# Patient Record
Sex: Male | Born: 1945 | Race: White | Hispanic: No | Marital: Single | Smoking: Former smoker
Health system: Southern US, Community
[De-identification: ages and names within clinical notes are randomized; demographics above are authoritative.]

## PROBLEM LIST (undated history)

## (undated) DIAGNOSIS — I4892 Unspecified atrial flutter: Secondary | ICD-10-CM

## (undated) DIAGNOSIS — J449 Chronic obstructive pulmonary disease, unspecified: Secondary | ICD-10-CM

## (undated) DIAGNOSIS — I4729 Other ventricular tachycardia: Secondary | ICD-10-CM

## (undated) DIAGNOSIS — I472 Ventricular tachycardia: Secondary | ICD-10-CM

## (undated) DIAGNOSIS — F329 Major depressive disorder, single episode, unspecified: Secondary | ICD-10-CM

## (undated) DIAGNOSIS — F32A Depression, unspecified: Secondary | ICD-10-CM

## (undated) DIAGNOSIS — K5190 Ulcerative colitis, unspecified, without complications: Secondary | ICD-10-CM

## (undated) DIAGNOSIS — C61 Malignant neoplasm of prostate: Secondary | ICD-10-CM

## (undated) DIAGNOSIS — Z96649 Presence of unspecified artificial hip joint: Secondary | ICD-10-CM

## (undated) DIAGNOSIS — I10 Essential (primary) hypertension: Secondary | ICD-10-CM

## (undated) DIAGNOSIS — E119 Type 2 diabetes mellitus without complications: Secondary | ICD-10-CM

## (undated) HISTORY — PX: BACK SURGERY: SHX140

## (undated) HISTORY — DX: Unspecified atrial flutter: I48.92

## (undated) HISTORY — DX: Major depressive disorder, single episode, unspecified: F32.9

## (undated) HISTORY — DX: Presence of unspecified artificial hip joint: Z96.649

## (undated) HISTORY — DX: Other ventricular tachycardia: I47.29

## (undated) HISTORY — DX: Ventricular tachycardia: I47.2

## (undated) HISTORY — DX: Malignant neoplasm of prostate: C61

## (undated) HISTORY — PX: TONSILLECTOMY: SUR1361

## (undated) HISTORY — DX: Depression, unspecified: F32.A

## (undated) HISTORY — PX: ILEOSTOMY: SHX1783

## (undated) HISTORY — DX: Type 2 diabetes mellitus without complications: E11.9

## (undated) HISTORY — PX: HIP SURGERY: SHX245

---
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
## 2002-07-05 ENCOUNTER — Encounter: Payer: Self-pay | Admitting: Neurological Surgery

## 2002-07-09 ENCOUNTER — Inpatient Hospital Stay (HOSPITAL_COMMUNITY): Admission: RE | Admit: 2002-07-09 | Discharge: 2002-07-17 | Payer: Self-pay | Admitting: Neurological Surgery

## 2002-07-09 ENCOUNTER — Encounter: Payer: Self-pay | Admitting: Neurological Surgery

## 2002-07-11 ENCOUNTER — Encounter: Payer: Self-pay | Admitting: Neurological Surgery

## 2002-07-11 ENCOUNTER — Encounter: Payer: Self-pay | Admitting: Pulmonary Disease

## 2002-07-12 ENCOUNTER — Encounter: Payer: Self-pay | Admitting: Neurological Surgery

## 2002-07-13 ENCOUNTER — Encounter: Payer: Self-pay | Admitting: Pulmonary Disease

## 2002-07-15 ENCOUNTER — Encounter: Payer: Self-pay | Admitting: Neurological Surgery

## 2002-07-16 ENCOUNTER — Encounter: Payer: Self-pay | Admitting: Cardiology

## 2002-11-13 ENCOUNTER — Ambulatory Visit (HOSPITAL_COMMUNITY): Admission: RE | Admit: 2002-11-13 | Discharge: 2002-11-13 | Payer: Self-pay | Admitting: Neurological Surgery

## 2002-11-15 ENCOUNTER — Ambulatory Visit (HOSPITAL_COMMUNITY): Admission: RE | Admit: 2002-11-15 | Discharge: 2002-11-15 | Payer: Self-pay | Admitting: Neurological Surgery

## 2003-02-14 ENCOUNTER — Observation Stay (HOSPITAL_COMMUNITY): Admission: RE | Admit: 2003-02-14 | Discharge: 2003-02-15 | Payer: Self-pay | Admitting: Neurological Surgery

## 2003-06-16 ENCOUNTER — Ambulatory Visit (HOSPITAL_COMMUNITY): Admission: RE | Admit: 2003-06-16 | Discharge: 2003-06-16 | Payer: Self-pay | Admitting: Neurological Surgery

## 2003-10-09 ENCOUNTER — Ambulatory Visit: Payer: Self-pay | Admitting: Physical Medicine & Rehabilitation

## 2003-10-09 ENCOUNTER — Inpatient Hospital Stay (HOSPITAL_COMMUNITY): Admission: RE | Admit: 2003-10-09 | Discharge: 2003-10-18 | Payer: Self-pay | Admitting: Orthopedic Surgery

## 2003-10-18 ENCOUNTER — Inpatient Hospital Stay
Admission: RE | Admit: 2003-10-18 | Discharge: 2003-10-25 | Payer: Self-pay | Admitting: Physical Medicine & Rehabilitation

## 2003-10-18 ENCOUNTER — Ambulatory Visit: Payer: Self-pay | Admitting: Physical Medicine & Rehabilitation

## 2004-05-05 ENCOUNTER — Ambulatory Visit: Payer: Self-pay | Admitting: Cardiology

## 2005-04-22 ENCOUNTER — Ambulatory Visit: Payer: Self-pay | Admitting: Cardiology

## 2005-05-10 ENCOUNTER — Ambulatory Visit: Payer: Self-pay

## 2005-05-10 ENCOUNTER — Encounter: Payer: Self-pay | Admitting: Cardiology

## 2005-12-20 ENCOUNTER — Ambulatory Visit: Payer: Self-pay | Admitting: Ophthalmology

## 2005-12-27 ENCOUNTER — Ambulatory Visit: Payer: Self-pay | Admitting: Ophthalmology

## 2006-02-01 ENCOUNTER — Encounter: Admission: RE | Admit: 2006-02-01 | Discharge: 2006-02-01 | Payer: Self-pay | Admitting: Orthopedic Surgery

## 2006-04-27 ENCOUNTER — Encounter: Admission: RE | Admit: 2006-04-27 | Discharge: 2006-04-27 | Payer: Self-pay | Admitting: Orthopedic Surgery

## 2006-05-10 ENCOUNTER — Ambulatory Visit: Payer: Self-pay | Admitting: Cardiology

## 2007-02-14 ENCOUNTER — Other Ambulatory Visit: Payer: Self-pay

## 2007-02-14 ENCOUNTER — Ambulatory Visit: Payer: Self-pay | Admitting: Ophthalmology

## 2007-02-27 ENCOUNTER — Ambulatory Visit: Payer: Self-pay | Admitting: Ophthalmology

## 2007-04-20 ENCOUNTER — Ambulatory Visit: Payer: Self-pay | Admitting: Cardiology

## 2007-07-05 ENCOUNTER — Ambulatory Visit: Payer: Self-pay | Admitting: Internal Medicine

## 2007-09-04 ENCOUNTER — Ambulatory Visit: Payer: Self-pay | Admitting: Internal Medicine

## 2008-05-08 ENCOUNTER — Telehealth: Payer: Self-pay | Admitting: Cardiology

## 2008-09-30 ENCOUNTER — Ambulatory Visit: Payer: Self-pay | Admitting: Internal Medicine

## 2008-09-30 DIAGNOSIS — R002 Palpitations: Secondary | ICD-10-CM | POA: Insufficient documentation

## 2008-09-30 DIAGNOSIS — E785 Hyperlipidemia, unspecified: Secondary | ICD-10-CM

## 2008-09-30 DIAGNOSIS — I4892 Unspecified atrial flutter: Secondary | ICD-10-CM

## 2008-09-30 DIAGNOSIS — I10 Essential (primary) hypertension: Secondary | ICD-10-CM

## 2008-12-25 ENCOUNTER — Ambulatory Visit: Payer: Self-pay | Admitting: Orthopedic Surgery

## 2009-02-04 ENCOUNTER — Inpatient Hospital Stay: Payer: Self-pay | Admitting: Surgery

## 2009-10-03 ENCOUNTER — Ambulatory Visit: Payer: Self-pay | Admitting: Internal Medicine

## 2009-10-03 DIAGNOSIS — J40 Bronchitis, not specified as acute or chronic: Secondary | ICD-10-CM | POA: Insufficient documentation

## 2009-11-11 ENCOUNTER — Encounter: Admission: RE | Admit: 2009-11-11 | Discharge: 2009-11-11 | Payer: Self-pay | Admitting: Neurological Surgery

## 2009-12-09 ENCOUNTER — Telehealth: Payer: Self-pay | Admitting: Internal Medicine

## 2010-02-13 ENCOUNTER — Telehealth (INDEPENDENT_AMBULATORY_CARE_PROVIDER_SITE_OTHER): Payer: Self-pay | Admitting: *Deleted

## 2010-02-13 ENCOUNTER — Ambulatory Visit
Admission: RE | Admit: 2010-02-13 | Discharge: 2010-02-13 | Payer: Self-pay | Source: Home / Self Care | Attending: Internal Medicine | Admitting: Internal Medicine

## 2010-02-13 ENCOUNTER — Encounter: Payer: Self-pay | Admitting: Cardiology

## 2010-02-16 ENCOUNTER — Encounter: Payer: Self-pay | Admitting: Cardiovascular Disease

## 2010-02-16 ENCOUNTER — Ambulatory Visit
Admission: RE | Admit: 2010-02-16 | Discharge: 2010-02-16 | Payer: Self-pay | Source: Home / Self Care | Attending: Cardiology | Admitting: Cardiology

## 2010-02-19 NOTE — Assessment & Plan Note (Signed)
Summary: Heart fluttering, increased BP  Nurse Visit   Vital Signs:  Patient profile:   65 year old male Height:      69 inches Pulse rate:   142 / minute BP sitting:   118 / 78  (left arm) Cuff size:   regular  Vitals Entered By: Darlyne Russian RN (February 13, 2010 1:12 PM) CC: Pt c/o not being able to "feel his pulse" BP machine at home not registering. Comments Pt in today, EKG shows rapid a-flutter. Pt is asymptomatic. He has h/o a flutter and was on coumadin in past. Pt not currently on coumadin. He is taking Diltiazem XR 288m once daily and Metoprolol Tartrate 740mtwo times a day. Dr. McAundra Dubinware of EKG and pt hx. We changed pt's Metoprolol to Toprol XL 7528mwo times a day. Pt will f/u Monday 02/16/10 for nurse visit for repeat EKG to see if pt still in a-flutter.    Allergies: 1)  ! Penicillin 2)  ! Sulfa 3)  ! Erythromycin 4)  ! Morphine  Patient Instructions: 1)  Your physician recommends that you schedule a follow-up appointment on Monday 02/16/10. 2)  Your physician has recommended you make the following change in your medication: STOP taking Metoprolol Tartrate. START Toprol XL 20m7m1/2 tablets two times a day.  Prescriptions: TOPROL XL 50 MG XR24H-TAB (METOPROLOL SUCCINATE) Take 1 1/2 tablets by mouth two times a day.  #90 x 6   Entered by:   MegaDarlyne Russian  Authorized by:   DaltLoralie Champagne   Signed by:   MegaDarlyne Russianon 02/13/2010   Method used:   Electronically to        [facility]       [address]   [place]  [id number]   Ph: [phone]   Fax: [phone]:   [id number]

## 2010-02-19 NOTE — Progress Notes (Signed)
Summary: ? about pulse/fluttering heart  Phone Note Call from Patient Call back at Home Phone [phone]   Caller: Patient Call For: Nurse/Dr.Taylor Complaint: Breathing Problems Summary of Call: Pt doesnt feel like he has a pulse. He denies dizziness, chest pain and SOB.  pt states his heart is fluttering some also. pt BP is 142/76, pulse is 90.  Initial call taken by: Rodman Comp CMA,  February 13, 2010 11:05 AM  Follow-up for Phone Call        notified patient needs to come in for nurse visit. Follow-up by: Dolores Lory, [place],  February 13, 2010 12:18 PM

## 2010-02-19 NOTE — Assessment & Plan Note (Signed)
Summary: 1 YR FU MH   Visit Type:  Follow-up Primary Provider:  August Luz.  CC:  c/o problems with stomach and back pain; he is going for physical therapy for lower back..  History of Present Illness: Mr. Rodwell returns today for followup of palpitations and HTN.  The patient has one remote episode of post op atrial flutter documented years ago.  Since then, he's been stable from an arrhythmia perspective with only short lived palpitations.  He denies c/p or sob or peripheral edema. He admits to a chronic cough with bronchitis. No fever or chills.  Current Medications (verified): 1)  Diltiazem Hcl Er Beads 240 Mg Xr24h-Cap (Diltiazem Hcl Er Beads) .... Take One Capsule By Mouth Daily 2)  Metoprolol Tartrate 50 Mg Tabs (Metoprolol Tartrate) .... Take 1 1/2 Tablets By Mouth Two Times A Day 3)  Citalopram Hydrobromide 40 Mg Tabs (Citalopram Hydrobromide) .... Take 1 By Mouth Once Daily 4)  Tramadol Hcl 50 Mg Tabs (Tramadol Hcl) .... Take 1 By Mouth Once Daily 5)  Vitamin B-1 100 Mg Tabs (Thiamine Hcl) .... Take 1 By Mouth Once Daily 6)  Vicodin Es 7.5-750 Mg Tabs (Hydrocodone-Acetaminophen) .... As Needed 7)  Potassium Citrate  27m .... One Tablet Once Daily 8)  Cinnamon 4541m.... Two Tablets Once Daily 9)  Magnesium 200 Mg Tabs (Magnesium) .... One Tablet Once Daily 10)  Flexeril 5 Mg Tabs (Cyclobenzaprine Hcl) .... One Tablet Once Daily 11)  M2 Zinc-50 50 Mg Tabs (Zinc) .... One Tablet Once Daily  Allergies (verified): 1)  ! Penicillin 2)  ! Sulfa 3)  ! Erythromycin 4)  ! Morphine  Past History:  Past Medical History: Last updated: 09/30/2008  1. Palpitations.   2. History of atrial flutter, now well controlled with medical       therapy.   3. Nonsustained ventricular tachycardia of no clinical significance at       present.   4. 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   5. H/O Asthma  6. H/O Prostate Cancer  Review of Systems       The patient complains of  hoarseness.  The patient denies chest pain, syncope, dyspnea on exertion, and peripheral edema.    Vital Signs:  Patient profile:   [age]ear old male Height:      69 inches Weight:      237 pounds BMI:     35.13 Pulse rate:   65 / minute BP sitting:   110 / 68  (left arm) Cuff size:   large  Vitals Entered By: ShDolores LoryCMA (October 03, 2009 10:52 AM)  Physical Exam  General:  Well developed, well nourished, in no acute distress. Head:  normocephalic and atraumatic Eyes:  PERRLA/EOM intact; conjunctiva and lids normal. Mouth:  Teeth, gums and palate normal. Oral mucosa normal. Neck:  Neck supple, no JVD. No masses, thyromegaly or abnormal cervical nodes. Lungs:  Scattered wheezes and rhonchi. Heart:  RRR with normal S1 and S2.  PMI is not enlarged or laterally displaced. Abdomen:  Bowel sounds positive; abdomen soft and non-tender without masses, organomegaly, or hernias noted. No hepatosplenomegaly. Msk:  Back normal, normal gait. Muscle strength and tone normal. Pulses:  pulses normal in all 4 extremities Extremities:  No clubbing or cyanosis. Neurologic:  Alert and oriented x 3.   Impression & Recommendations:  Problem # 1:  ESSENTIAL HYPERTENSION, BENIGN (ICD-401.1) His blood pressure is well controlled.  I have asked him to switch to once daily  metoprolol succinate. Also, a low sodium diet is recommended. His updated medication list for this problem includes:    Diltiazem Hcl Er Beads 240 Mg Xr24h-cap (Diltiazem hcl er beads) .[name]... Take one capsule by mouth daily    Metoprolol Succinate 50 Mg Xr24h-tab (Metoprolol succinate) .[name]... Take 1 tab by mouth at bedtime  Problem # 2:  PALPITATIONS (ICD-785.1) these appear to be well controlled.  Continue meds as  below. His updated medication list for this problem includes:    Diltiazem Hcl Er Beads 240 Mg Xr24h-cap (Diltiazem hcl er beads) .[name]... Take one capsule by mouth daily    Metoprolol Succinate 50 Mg Xr24h-tab  (Metoprolol succinate) .[name]... Take 1 tab by mouth at bedtime  Problem # 3:  BRONCHITIS NOT SPECIFIED AS ACUTE OR CHRONIC (ICD-490) His symptoms are not severe. I have asked him to take Guafenisen.  Patient Instructions: 1)  Your physician has recommended you make the following change in your medication: STOP metoprolo tartrated  START metoprolol succinatel 2)  Your physician wants you to follow-up in:  12 months  You will receive a reminder letter in the mail two months in advance. If you don't receive a letter, please call our office to schedule the follow-up appointment. Prescriptions: METOPROLOL SUCCINATE 50 MG XR24H-TAB (METOPROLOL SUCCINATE) Take 1 tab by mouth at bedtime  #30 x 12   Entered by:   Cordelia Pen, RN   Authorized by:   Sophronia Simas, MD, [facility]   Signed by:   Cordelia Pen, RN on 10/03/2009   Method used:   Electronically to        [facility] (retail)       [address]       [place], [place]  [address]       Ph: [phone]       Fax: [phone]   RxID:   [id number]

## 2010-02-19 NOTE — Progress Notes (Signed)
Summary: Metoprolol Rx  Phone Note Call from Patient Call back at Home Phone [phone]   Caller: self Call For: [name] Summary of Call: Pt is on Metoprolol Suffinate-Would like to go back on medication that he was on before-Pt feels like the previous medication worked better-Does not remember the name of that medication-It was 100+ mg Initial call taken by: Zenovia Jarred,  December 09, 2009 3:42 PM  Follow-up for Phone Call        Called spoke with pt at [facility] with Dr [name] in 09/11 pt was switched from Metoprolol Tartrate 17m 1.5 tablets two times a day to Metoprolol Succinate 564monce daily. Pt states since he has switched he has been having more frequent episodes of his heart fluttering. Pt wants to know if he can switch back.  Please advise.  Thanks. Follow-up by: ErFreddrick MarchN,  December 09, 2009 3:54 PM  Additional Follow-up for Phone Call Additional follow up Details #1::        ok to go back to original dose KeJanan HalterRN, BSN  December 09, 2009 4:30 PM  Called spoke with pt advised of to switch back to Metoprolol Tartrate 5070m.5 tablets two times a day.  Rx sent to [facility] at pt's request.  Pt aware.  Additional Follow-up by: EriFreddrick March,  December 09, 2009 4:42 PM    New/Updated Medications: METOPROLOL TARTRATE 50 MG TABS (METOPROLOL TARTRATE) Take one and a half tablets by mouth twice a day Prescriptions: METOPROLOL TARTRATE 50 MG TABS (METOPROLOL TARTRATE) Take one and a half tablets by mouth twice a day  #90 x 6   Entered by:   EriFreddrick March   Authorized by:   GreSophronia SimasD, FACPinnacle Cataract And Laser Institute LLCSigned by:   EriFreddrick March on 12/09/2009   Method used:   [facility]ically to        [facility]       [address]    [place]  [id number]    Ph: [phone]    Fax: [phone]:   [id number]

## 2010-02-25 NOTE — Assessment & Plan Note (Signed)
Summary: ekg/BP check per Mclean/sab  Nurse Visit   Vital Signs:  Patient profile:   65 year old male Height:      69 inches Weight:      222 pounds BMI:     32.90 Pulse rate:   88 / minute BP sitting:   138 / 72  (left arm) Cuff size:   regular  Vitals Entered By: Dolores Lory, CMA (February 16, 2010 11:00 AM) CC: "Doing well today".  Comments Pt returning for f/u EKG. Pt was in A-Flutter with RVR friday 02/13/10, changed Metoprolol Tartrate 25m two times a day to Toprol XL 792mtwo times a day. Pt also taking Percocet for back and leg pain and he "feels a-flutter symptoms after taking Percocet." Pt's EKG today shows NSR and pt has no c/o. Notified pt that he may have noticed the symptoms after having the back pain rather than the Percocet "causing" the a-flutter. Pt is not currently anticoagulated. Pt will call our office if he has same symptoms again, otherwise, he will continue Toprol XL 7556mwo times a day.    Past History:  Past Medical History: Last updated: 09/30/2008  1. Palpitations.   2. History of atrial flutter, now well controlled with medical       therapy.   3. Nonsustained ventricular tachycardia of no clinical significance at       present.   4. 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   5. H/O Asthma  6. H/O Prostate Cancer   Current Medications (verified): 1)  Diltiazem Hcl Er Beads 240 Mg Xr24h-Cap (Diltiazem Hcl Er Beads) .... Take One Capsule By Mouth Daily 2)  Toprol Xl 50 Mg Xr24h-Tab (Metoprolol Succinate) .... Take 1 1/2 Tablets By Mouth Two Times A Day. 3)  Citalopram Hydrobromide 40 Mg Tabs (Citalopram Hydrobromide) .... Take 1 By Mouth Once Daily 4)  Tramadol Hcl 50 Mg Tabs (Tramadol Hcl) .... Take 1 By Mouth Once Daily 5)  Vitamin B-1 100 Mg Tabs (Thiamine Hcl) .... Take 1 By Mouth Once Daily 6)  Vicodin Es 7.5-750 Mg Tabs (Hydrocodone-Acetaminophen) .... As Needed 7)  Potassium Citrate  74m42m.. One Tablet Once Daily 8)  Cinnamon 450mg46m. Two  Tablets Once Daily 9)  Magnesium 200 Mg Tabs (Magnesium) .... One Tablet Once Daily 10)  Flexeril 5 Mg Tabs (Cyclobenzaprine Hcl) .... One Tablet Once Daily 11)  M2 Zinc-50 50 Mg Tabs (Zinc) .... One Tablet Once Daily  Allergies (verified): 1)  ! Penicillin 2)  ! Sulfa 3)  ! Erythromycin 4)  ! Morphine  CC:  "Doing well today". .[name]

## 2010-04-20 ENCOUNTER — Inpatient Hospital Stay (HOSPITAL_COMMUNITY): Admission: RE | Admit: 2010-04-20 | Payer: Self-pay | Source: Ambulatory Visit | Admitting: Neurological Surgery

## 2010-04-21 ENCOUNTER — Telehealth: Payer: Self-pay | Admitting: Emergency Medicine

## 2010-04-21 NOTE — Telephone Encounter (Signed)
Pt came in office today stating his insurance doesn't cover his metoprolol succinate 46m 1 1/2 tablets bid. Insurance recommended he try a lower tier medication- Verapamil or Propanolol. Please advise. Thanks, SClarise Cruz

## 2010-05-04 ENCOUNTER — Telehealth: Payer: Self-pay | Admitting: Internal Medicine

## 2010-05-04 NOTE — Telephone Encounter (Signed)
Sent staff message to Dr. Lovena Le that pt calling again regarding medication change (see phone note from 04/21/10).

## 2010-05-07 NOTE — Telephone Encounter (Signed)
Would suggest metoprolol tartate 25 twice daily.

## 2010-05-08 ENCOUNTER — Telehealth: Payer: Self-pay | Admitting: *Deleted

## 2010-05-08 MED ORDER — METOPROLOL TARTRATE 25 MG PO TABS: 25.0000 mg | ORAL_TABLET | Freq: Two times a day (BID) | ORAL | Status: DC

## 2010-05-08 NOTE — Telephone Encounter (Signed)
Pt called back after recent change from Metoprolol succ 37m b.i.d to metoprolol tart 25 b.i.d. Pt is concerned that this will not control BP as he has had trouble in the past. Pt was changed due to insurance reasons, but wants to know if we can call in higher dosage? Please advise.

## 2010-05-08 NOTE — Telephone Encounter (Signed)
Duplicate msg. Tried to call pt no answer. Will document on previous msg.

## 2010-05-08 NOTE — Telephone Encounter (Signed)
[facility] TCB/sab

## 2010-05-12 NOTE — Telephone Encounter (Signed)
[name]! Can you ask Dr. Lovena Le about this patient's medication. Pt is calling again regarding his medication. He wants to know if the metoprolol tartrate 88m bid will be enough since he was taking metoprolol succinate 77mbid. Pt also wanted to know if there are any other medications that will be less expensive. Thanks, SaClarise Cruz

## 2010-05-12 NOTE — Telephone Encounter (Signed)
Dr Lovena Le said pt should be on 82m bid

## 2010-05-13 ENCOUNTER — Other Ambulatory Visit: Payer: Self-pay | Admitting: Internal Medicine

## 2010-05-13 MED ORDER — METOPROLOL TARTRATE 50 MG PO TABS: 50.0000 mg | ORAL_TABLET | ORAL | Status: DC

## 2010-05-13 NOTE — Telephone Encounter (Signed)
rx called into pharmacy. Pt notified

## 2010-05-13 NOTE — Telephone Encounter (Signed)
OK thanks. I will call in metoprolol tartrate 81m 1 1/2 tablets bid replacing the succinate that was not covered by insurance.

## 2010-05-14 ENCOUNTER — Other Ambulatory Visit: Payer: Self-pay | Admitting: *Deleted

## 2010-05-14 MED ORDER — DILTIAZEM HCL ER 240 MG PO CP24: 240.0000 mg | ORAL_CAPSULE | Freq: Every day | ORAL | Status: DC

## 2010-06-02 NOTE — Assessment & Plan Note (Signed)
[facility] OFFICE NOTE   NAME:Buikema, JOSEPHUS                        MRN:          [id number]  DATE:07/05/2007                            DOB:          1945-01-31    Mr. Bocock returns today after a long absence from our [facility].  He  is a very pleasant 28-year male with palpitations, dizziness, and near-  syncope who has a history of documented atrial flutter (question atrial  fib) diagnosed several years ago.  He has been on Cardizem since then.  The patient has had an increase in frequency and severity of his  palpitations in the last several months and is here today for additional  evaluation.  He denies chest pain.  He denies shortness of breath except  when he feels his palpitations.  He has had no frank syncope.   His medications include Cartia 240 a day, vitamins, and chondroitin and  glucosamine.   PHYSICAL EXAMINATION:  GENERAL:  He is a pleasant well-appearing middle-  aged man in no distress.  VITAL SIGNS:  Blood pressure was 118/70, the pulse was 68 and regular,  respirations were 18, and weight was 216 pounds.  NECK:  Revealed no jugular venous distention.  LUNGS:  Clear bilaterally to auscultation.  No wheezes, rales, or  rhonchi are present.  CARDIOVASCULAR:  Regular rate and rhythm.  Normal S1 and S2.  EXTREMITIES:  Demonstrate no edema.   EKG demonstrates sinus rhythm with normal axes and intervals.   IMPRESSION:  1. Symptomatic palpitations.  2. Near-syncope secondary to symptomatic palpitations.   DISCUSSION:  Overall, Mr. Flanagan is stable, but I am concerned about  his increased palpitations.  I think that there are not bad enough to  recommend an ablation at this point, but I think we should study these a  little more carefully and I have recommend that he undergo CardioNet  monitoring for this.  We will plan to see the patient back in the office  several weeks after he has  had a chance to wear his CardioNet, and  hopefully, we can better understand the mechanism of his symptoms.     Champ Mungo. Lovena Le, MD  Electronically Signed    GWT/MedQ  DD: 07/05/2007  DT: 07/06/2007  Job #: [id number]   cc:   Benita Stabile

## 2010-06-02 NOTE — Progress Notes (Signed)
[facility] OFFICE NOTE   NAME:Dessert, KAYNEN                        MRN:          [id number]  DATE:09/04/2007                            DOB:          25-Feb-1945    Mr. Ganas returns today for followup.  He is a very pleasant 65-year-  old man I saw back in June for worsening tachy palpitations, dizziness,  and near syncope.  He has a history of atrial flutter, which had been  documented in the past, but his symptoms were not very severe and for  this reason, I have recommend he wear a CardioNet monitor.  This  demonstrated mostly sinus rhythm with occasional PVCs.  He did have a  bout of atrial flutter and he also had a very brief episode of  nonsustained VT, but otherwise no other symptoms.  No other findings  were noted.  He had no significant bradycardia.  He returns today for  followup and overall has been stable.  He tolerates his beta blockers  and calcium channel blockers very nicely.  He does not have angina and  has otherwise been stable.  Review of the [place] monitor is as  demonstrated above.  He did have 1 episode of nonsustained VT at 117  beats per minute lasting for all of 12 beats.  The patient notes that  his symptoms bother him mostly and lies down to sleep at 9.   PHYSICAL EXAMINATION:  GENERAL:  On physical exam he is a pleasant,  middle-aged man in no acute distress.  VITAL SIGNS:  Blood pressure today was 110/70, the pulse was 78 and  regular, respirations were 18, weight was 206 pounds.  NECK:  Revealed no jugular venous distention.  LUNGS:  Clear bilaterally to auscultation.  No wheezes, rales, or  rhonchi are present.  CARDIOVASCULAR EXAM:  Revealed a regular rate and rhythm with normal S1  and S2.  ABDOMINAL:  Soft and nontender.  EXTREMITIES:  Demonstrate no edema.   IMPRESSION:  1. Palpitations.  2. History of atrial flutter, now well controlled with medical  therapy.  3. Nonsustained ventricular tachycardia of no clinical significance at      present.  4. 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   DISCUSSION:  Mr. Chiriboga is stable.  His arrhythmias are well  controlled.  I have recommend a period of watchful waiting and  continuation of his beta-blocker and calcium-channel blocker.  I will  see him back in several months.     Champ Mungo. Lovena Le, MD  Electronically Signed    GWT/MedQ  DD: 09/04/2007  DT: 09/05/2007  Job #: [id number]   cc:   Benita Stabile

## 2010-06-02 NOTE — Assessment & Plan Note (Signed)
[facility] OFFICE NOTE   NAME:[name], [name]                        MRN:          [id number]  DATE:04/20/2007                            DOB:          23-Apr-1945    PRIMARY CARE PHYSICIAN:  Dr. Benita Stabile, [place], [place].   HISTORY:  [name] is a 65 year old and returns for management of his  paroxysmal atrial flutter.  He is currently disabled due to back and  other problems.  [name] has a long history of atrial flutter, and  he was seen in consultation by Dr. Lovena Le, but together they decided not  to proceed with ablation.  He has been managed on beta blocker therapy  and Cardizem.   He says his palpitation has become more recent over the past number of  months.  He says he will get episodes where his heart beats fast and may  last 2 hours.  This may occur several times a month.  He has no  associated chest pain or shortness of breath.   His past medical history significant for hip replacement and back  surgery.  Dr. French Ana has been treating him for this.   CURRENT MEDICATIONS:  1. Flonase.  2. Metoprolol 50 mg 1/2 tablet b.i.d.  3. Cardia XT 180 mg daily.  4. Tramadol.  5. Meloxicam.  6. Gabapentin.   PHYSICAL EXAMINATION:  VITAL SIGNS:  Blood pressure 126/72 and pulse 71  and regular.  NECK:  There was no venous distension.  The carotid pulses were full  without bruits.  CHEST:  Clear.  CARDIAC:  Rhythm is regular.  No murmurs or gallops.  The soft with  normal bowel sounds.  There is no hepatosplenomegaly.  Peripheral pulses  were full with no peripheral edema.   STUDIES:  Electrocardiogram  showed one PVC and was otherwise normal.   IMPRESSION:  1. Paroxysmal atrial fibrillation, atrial flutter.  2. Status post hip replacement.  3. Degenerative disease of the lumbar spine.  4. Chronic disability.  5. Ulcerative colitis status post previous colectomy and colostomy.   RECOMMENDATIONS:  [name] is having increasing symptoms of  palpitations which likely represent atrial flutter.  He has been  reluctant to consider ablation in the past.  Will increase his Cardizem  XT from 180 to 240 a day and continue his Toprol at the current dosage.  If he is not  improved on this regimen he needs to give [place] a call, and we will  consider ablation.  Otherwise, I will see him back in the year.     Bruce Alfonso Patten Olevia Perches, MD, [facility]  Electronically Signed    BRB/MedQ  DD: 04/20/2007  DT: 04/20/2007  Job #: [id number]

## 2010-06-05 NOTE — Op Note (Signed)
NAMEKARTER, HELLMER                           ACCOUNT NO.:  [id number]   MEDICAL RECORD NO.:  [id number]                   PATIENT TYPE:  INP   LOCATION:  3013                                 FACILITY:  [facility]   PHYSICIAN:  Earleen Newport, M.D.               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   POSTOPERATIVE DIAGNOSIS:  Status post lumbar fusion L4 to sacrum with right lumbar radiculopathy.   OPERATION:  Removal of right sided pedicular instrumentation L4, L5, and  sacrum with METRx system and fluoroscopy.   SURGEON:  Earleen Newport, M.D.   ANESTHESIA:  General endotracheal anesthesia.   INDICATIONS FOR PROCEDURE:  The patient is a 65 year old individual who had  significant back and right lower extremity pain.  About a year ago, he  underwent fusion of L4 to the sacrum for spondylolisthesis.  He was doing  well, however, he developed a right lumbar radiculopathy and it was suspect  that he may have had some cut out of a right sided screw at L5.   PROCEDURE:  The patient was brought to the operating room supine on the  stretcher.  After a smooth induction of general endotracheal anesthesia, he  was turned prone.  The back was shaved, prepped with Hibiclens and then  alcohol, and draped in a sterile fashion.  Using fluoroscopic guidance, the  screws on the right side were localized, the skin was marked, and a central  area of incision was created measuring 18 mm in length.  A K-wire was then  passed to the central screw and over the K-wire a series of dilators were  passed after a linear incision was made in the fascia.  The screw head was  isolated with an 18 mm diameter tube and through this the screw head was  loosened.  The top hardware was removed.  A similar procedure was  completed  at L4 and the sacrum.  Then, using some dissection with the Bovie, the area  over the rod was released and the rod could be mobilized with the use of a  right angle hook.  This was brought up through the tube, also.  Then, each  of the screws were sequentially loosened and removed with an appropriate  screwdriver.  The area was inspected radiographically and the hardware was  removed well on the right side.  There was some concern that his arthrodesis  may still be healing and some supplemental fixation would be useful so we  did not remove the left side.  With this, the area was copiously irrigated  with antibiotic irrigating solution.  The fascia was closed with 3-0 Vicryl  in an interrupted fashion, 3-0 Vicryl was used to  close the subcuticular  skin.  The patient tolerated the procedure well.  Blood loss estimated at  about 50 mL.                                               Earleen Newport, M.D.    Drucilla Schmidt  D:  02/14/2003  T:  02/14/2003  Job:  [id number]

## 2010-06-05 NOTE — H&P (Signed)
NAME:  [name], [name]                    ACCOUNT NO.:  [id number]   MEDICAL RECORD NO.:  [id number]                   PATIENT TYPE:  INP   LOCATION:  NA                                   FACILITY:  [facility]   PHYSICIAN:  Lockie Pares, M.D.                 DATE OF BIRTH:  August 16, 1945   DATE OF ADMISSION:  10/09/2003  DATE OF DISCHARGE:                                HISTORY & PHYSICAL   CHIEF COMPLAINT:  Right hip pain for the last two years.   HISTORY OF PRESENT ILLNESS:  This 65 year old white male patient presented  to Dr. French Ana with a two-year history of gradual onset but progressive  right hip pain.  He has had no known injury or prior surgery to his hip, but  the pain has been getting progressively worse over the last week or two.  At  this point, he has a constant sore sensation over the lateral aspect of the  hip with some radiation into the anterior thigh and all the way down to the  knee.  The pain increases if he is very active during the day or if he does  a lot of bending, and then decreases if he sites down.  He has difficulty  putting his socks and shoes on.  He cannot sleep on that right side.  He has  no back pain or paresthesias associated with the pain, although he does  report he has numbness on the bottom of both feet.  He has been ambulating  with a cane intermittently for the last year, and he currently take Vicodin  for pain.   ALLERGIES:  1.  PENICILLIN causes a rash.  2.  ERYTHROMYCIN causes nausea and vomiting.  3.  SULFA causes a rash.  4.  MORPHINE causes nausea and vomiting and severe constipation.   CURRENT MEDICATIONS:  1.  Cartia XT 180 mg 1 tablet p.o. q.a.m.  2.  Clonazepam 0.5 mg 1/2 tablet p.o. q.h.s. p.r.n.  3.  Coumadin 5 mg to take as directed with his last dose planned to      October 02, 2003.  4.  Singulair 10 mg 1 tablet p.o. q.a.m. p.r.n.  5.  Flonase 50 mcg 2 sprays each nostril q.a.m.  6.  Pulmicort 200 mcg 2 puffs inhaled  q.a.m.  7.  Albuterol inhaler 17 g 2 puffs inhaled q.4 h. p.r.n.  8.  Vicodin 5/500 mg 1/2-1 tablet p.o. q.4 h. p.r.n. for pain.  9.  Flax oil Omega-III 2 tablespoons p.o. q.a.m.   PAST MEDICAL HISTORY:  1.  He was diagnosed with atrial flutter in June of 2004.  2.  He was diagnosed with asthma in 1993.  He has not ever required      intubation or hospitalization for it.  3.  He denies any history of diabetes mellitus, hypertension, thyroid      disease, hiatal hernia, reflux, peptic ulcer disease, or  any other      chronic medical condition other than noted previously.   PAST SURGICAL HISTORY:  1.  Tonsillectomy in [place].  2.  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  3.  Debridement left leg in 1981.  4.  Surgery to repair deviated septum in 1983.  5.  Left inguinal herniorrhaphy in 1992.  6.  Sinus surgery in 1996.  7.  Prostatectomy in 2000.  8.  Lumbar fusion in 2004 by Earleen Newport, M.D.  9.  Removal of screws, lumbar spine, 2005 by Earleen Newport, M.D.   The only complication he reports from the above surgery was development of  an ileus due to morphine after the back surgery in 2004. This did require NG  tube placement.   SOCIAL HISTORY:  He has a 9 to 27-pack-year history of cigarette smoking.  He quit in 1978.  He did start using chewing tobacco, cigars, and pipe in  1981, but quit that in 1991.  He does not drink any alcohol nor use any  drugs.  He is single without any children.  He lives with his 73 year old  father in a one-story house with two steps into the main entrance.  He is on  disability.  His primary medical doctor is Benita Stabile, M.D. in [place], and  his phone number if [phone].   FAMILY HISTORY:  Mother died at the age of 72 with liver and pancreatic  cancer, diabetes, osteoarthritis, and osteoporosis.  His father is alive at  age 37 with osteoarthritis and glaucoma.   REVIEW OF SYSTEMS:  He does have occasional dizziness, and that was   evaluated by his cardiologist, who is Champ Mungo. Lovena Le, M.D. at [facility].  His asthma is managed by [facility] the [facility] in  [place].  He does have a cataract in his left eye and decreased vision in  his right eye.  He complains of occasional nasal congestion and occasional  stiff neck.  He does have a history of bronchitis in 2004.  He has some  occasional urinary dysuria and also nocturia at times.  He does wear  glasses.  He had a sinus infection about three weeks ago, and the was  treated effectively with antibiotics.  He does have a Living Will and his  power of attorney is [employer].  All other systems are negative and  noncontributory.   PHYSICAL EXAMINATION:  GENERAL:  Well-developed, well-nourished, mildly  overweight white male who walks with an antalgic gait and the use of a cane.  Mood and affect are appropriate.  Talks easily with the examiner.  Height 5  feet, 9.5 inches, weight 220 pounds.  The BMI is 31.  VITAL SIGNS:  Temperature 98.5 degrees Fahrenheit, pulse 92, respirations  12, BP 128/70.  HEENT:  Normocephalic, atraumatic, without frontal or maxillary sinus  tenderness to palpation.  Conjunctivae pink, sclerae anicteric.  PERRLA,  EOMs intact.  No visible external ear deformities.  Hearing grossly intact.  Unable to visualize tympanic membranes due to canals being occluded with  cerumen.  Nose and nasal septum midline.  Nasal mucosa pink, moist, without  exudates or polyps noted.  Buccal mucosa intact.  Pharynx with erythema or  exudates.  Tongue and uvula midline.  Tongue with fasciculations.  Uvula  rises equally with phonation.  Teeth do have some discoloration from plaque  and his gum line appears to have receded a little bit.  NECK:  No visible masses  or lesions noted.  Trachea midline.  No palpable  lymphadenopathy.  No thyromegaly.  Carotids +2 bilaterally without bruits. Full range of motion, nontender to palpation along the cervical  spine.  CARDIOVASCULAR:  Heart rate and rhythm regular.  S1 and S2 present without  rubs, clicks, or murmurs noted.  RESPIRATORY:  Respirations even and unlabored. Breath sounds clear to  auscultation bilaterally without rales or wheezes noted.  ABDOMEN:  Well-healed midline abdominal incision and ileostomy noted in his  right lower quadrant.  Bowel sounds present x4 quadrants.  Moderate amount  of soft brown stool noted in the ileostomy bag.  Nontender to palpation  without hepatosplenomegaly nor CVA tenderness.  Femoral pulses +1  bilaterally.  He does have a well-healed low lumbar incision line.  Some  mild tenderness to palpation in that area but otherwise no tenderness along  the vertebral column.  BREAST/GENITOURINARY/RECTAL:  These exams deferred at this time.  MUSCULOSKELETAL:  No obvious deformities bilateral upper extremities with  full range of motion of these extremities without pain.  Radial pulses +2.  He has full range of motion of his ankles and toes bilaterally.  DP and PT  pulses are +2 without lower extremity edema.  No calf pain with palpation,  negative Homans' sign bilaterally.  He has full range of motion of his left  knee without pain.  Full extension and flexion of his left hip to about 90  degrees with slightly decreased internal and external rotation, but it is  fairly full.  Right hip has full extension but flexion only to 45 degrees,  and that causes pain.  He has no internal and external rotation at this  time.  He also has decreased range of motion of his right knee.  He does  seem to have some pain along the medial joint line of the right knee, but  then when I sit him up, he does not have pain there.  He has some pain with  movement of the patella, but this seems to be intermittent.  No effusion.  No real crepitus with range of motion of the knee.  No calf pain with  palpation, and negative Homans' sign.  NEUROLOGIC:  Alert and oriented x3.  Cranial nerves  II-XII are grossly  intact.  Strength is 5/5 bilateral upper and lower extremities.  Rapid  alternating movements intact.  Deep tendon reflexes 2+ bilateral upper and  lower extremities.   RADIOLOGIC FINDINGS:  X-rays done in June of 2005 show severely-arthritic  right hip with cystic changes in the superior femoral head and that area has  collapse with bone-on-bone contact in the acetabulum.  The left hip appears  to be normal.   IMPRESSION:  1.  End-stage osteoarthritis, right hip.  2.  Atrial flutter.  3.  Asthma.  4.  History of prostate cancer.  5.  History of ulcerative colitis treated with ileostomy.  6.  Cataract.  7.  Degenerative disk disease, lumbar spine.   PLAN:  Mr. Rossa will be admitted to [facility] on October 09, 2003 where he will undergo a right total hip arthroplasty by Dr.  Vangie Bicker.  He will undergo all the routine preoperative laboratory tests and studies prior to this procedure.  If we have any medical issues while he is  hospitalized, we will consult either his cardiologist or one of the  hospitalists.      Vonita Moss Duffy, P.A.  [place] Pares, M.D.    KED/MEDQ  D:  09/24/2003  T:  09/24/2003  Job:  [id number]

## 2010-06-05 NOTE — Op Note (Signed)
[name], [name]                           ACCOUNT NO.:  [id number]   MEDICAL RECORD NO.:  [id number]                   PATIENT TYPE:  INP   LOCATION:  3172                                 FACILITY:  [facility]   PHYSICIAN:  Earleen Newport, M.D.               DATE OF BIRTH:  [birth year]   DATE OF PROCEDURE:  07/09/2002  DATE OF DISCHARGE:                                 OPERATIVE REPORT   PREOPERATIVE DIAGNOSIS:  Spondylolisthesis at L4-5, spondylosis, plus  herniated nucleus pulposus at L5-S1 with left lumbar radiculopathy.   POSTOPERATIVE DIAGNOSIS:  Spondylolisthesis at L4-5, spondylosis, plus  herniated nucleus pulposus at L5-S1 with left lumbar radiculopathy.   PROCEDURE:  L4 Gil procedure, L5 laminectomy, posterior lumbar interbody  arthrodesis with Synthes bone spacers at L4-5, posterolateral arthrodesis L4  to sacrum, segmental pedicle screw fixation L4, L5, and sacrum.   SURGEON:  Earleen Newport, M.D.   ASSISTANT:  Otilio Connors, M.D.   ANESTHESIA:  General endotracheal.   INDICATIONS FOR PROCEDURE:  The patient is a 65 year old individual who has  had significant back and bilateral lower extremity pain, but worse with left  lower extremity pain more recently. He has evidence of an old disk  herniation at the L5-S1 level that has been treated conservatively and this  now appears large and calcified.  He also has a mobile spondylolisthesis,  has a grade II spondylolisthesis, and a severe stenosis as a result of that.  He was advised regarding surgical decompression and stabilization from L4 to  the sacrum.   DESCRIPTION OF PROCEDURE:  The patient was brought to the operating room  supine and on a stretcher.  After some difficulty with intubation secondary  to an unusual airway, he was nasotracheally intubated and then treated with  general endotracheal anesthesia. He was turned prone. Care was taken to  protect his ostomy site with appropriate padding all around  the site. The  patient was then prepped on his back with Duraprep solution and then draped  in the usual sterile fashion.  A linear incision was made in the back and  carried down to the lumbodorsal fascia which was opened on either side of  the midline to expose the spinous processes of L4, L5, and the sacrum. These  were identified positively with fluoroscopy.  An InstaTrak fluoroscopic unit  was used to view the localizations with the InstaTrak reference arm being  placed on the laminar arch of the sacrum.  Attention was then turned to the  intralaminar space at L4-5 and also at L5-S1. These were cleared and it was  noted that there was a significant looseness in the facet joints at the L4-5  facet. These were undercut severely performing a Artis Delay procedure, removing the  larger portion of the lamina of L4 out to and including the common dural  tube, the foramen of the L4 nerve root superiorly.  These were decompressed  and there was a significant amount of thickened fascial tissue on each side  in the foramen and removal of this tissue allowed for decompression of the  L4 nerve root superiorly. With the patient in a prone position, it was noted  that his spondylolisthesis appeared reduced.  This caused a significant  bulge posteriorly in the disk at the L4-5 level and this was ultimately  incised and the disk was removed totally from side-to-side and left to  right. The procedure was carried out bilaterally, thus completing a complete  diskectomy. Self-retaining interbody spreader was used to enlarge the disk  space and allow for curettage and removal of the full endplate on the  inferior margin of the body of L4 and the superior margin of the body of L5.  Once this was completed, L5-S1 was similarly decompressed, however, after  performing a total laminectomy of the laminar arch of L5, it was noted that  the disk space was so severely sclerotic that entering the disk and removing  the  calcified fragment of disk on that left side was difficult and needed to  be performed with an osteotome. Even despite this removal, the disk space  itself was very tight, was very closed, though it was not fused.  It was  felt that a posterior interbody arthrodesis at this level would be very  difficult to perform and therefore it was decided to perform only a  posterolateral arthrodesis in the intertransverse space between the L5  transverse process and the ala of the sacrum.  Pedicle entry sites were then  chosen by localizing with the InstaTrak fluoroscopic navigational system and  pedicle entry sites were chosen then at L4, L5, and the sacrum. These were  verified by palpation and each of the screw holes were tapped with a 5.5 mm  tap and 6.2 x 40 mm screws were placed in L4 and L5 and the sacrum was  tapped and drilled with 6.2 x 35 mm screws.  The upper right hand screw at  L4, however, was somewhat loosened and laterally positioned on final x-ray  localization.  It was removed.  The screw hole was directed more medially.  However, to engage the screw fully, a 7 mm x 45 mm screw was placed in this  area.  Then, the posterolateral gutters were stripped of musculature and  periosteum. They were decorticated adequately. Bone graft from the  laminectomy and the [facility] procedure was then packed into the lateral gutters  on either side and once this was secured, the screw caps were applied.  65  mm pre contoured rods were inserted with the L4 screws being tightened  first.  A slight amount of compression was placed on L4-5 and this screw was  locked second. L5-S1 was then locked thirdly.  Final localizing radiograph  in the AP and lateral projections identified.  Positioning of the hardware.  The interbody grafts were also positioned.  The interbody grafts that were  placed at L4-5 were done with [facility] bone spacers measuring 11 mm in height. The interbody spacers were packed with bone chips  that were obtained from  the laminectomy. This was mixed with 10 mL of Cortoss bone expander and the  patient's own blood.  Wafers of V-toss bone expander were placed into the  lateral gutters and one wafer was placed over the posterior intralaminar  space from the L4 lamina to the sacral lamina. The dura was protected.  Care  was  taken prior to placing the bone graft. Gelfoam was placed over the  exiting nerve roots as they entered the foramen so that no bone chips would  be positioned directly over the nerve roots themselves.  The rest of the  bone was then adequately packed and care was again taken to make sure that  no bone entered into the region of the foramen.  With this, no spinal fluid  leaks were incurred during this decompression.  Large portion of the dura  had been exposed.  The fixation feeling to be sound, lumbodorsal fascia was  then closed using #1 Vicryl in interrupted fashion, 2-0 Vicryl  subcutaneously, 3-0 Vicryl subcuticularly. The patient tolerated the  procedure well and was returned to the recovery room in stable condition  after placing some Dermabond on the skin. Total estimated blood loss of 750  mL was obtained.                                               Earleen Newport, M.D.    Drucilla Schmidt  D:  07/09/2002  T:  07/10/2002  Job:  [id number]

## 2010-06-05 NOTE — Assessment & Plan Note (Signed)
[place]                                 ON-CALL NOTE   NAME:[name], [name]                        MRN:          [id number]  DATE:07/14/2007                            DOB:          1945-10-03    PRIMARY CARDIOLOGIST:  Dr. Eustace Quail.   HISTORY:  [name] is a 65 year old male who recently had a CardioNet  event monitor placed.  I received a phone call at approximately 06:15 in  the morning of July 14, 2007.  Monitor technician stated that he had an  11 beat run of wide complex tachycardia with a rate of approximately 117  at 4:03.  Technician again stated that the event was asymptomatic,  however, she did not talk to the patient.  It did relay that the  information was self activated.  I have instructed her to fax  information to the office as scheduled.  Again, suggested that she  cannot say if the patient is asymptomatic unless she spoke with the  patient.     Sharyl Nimrod, PA-C  Electronically Signed    EW/MedQ  DD: 07/14/2007  DT: 07/14/2007  Job #: [id number]

## 2010-06-05 NOTE — H&P (Signed)
[name], [name]                           ACCOUNT NO.:  [id number]   MEDICAL RECORD NO.:  [id number]                   PATIENT TYPE:  INP   LOCATION:  2887                                 FACILITY:  [facility]   PHYSICIAN:  Earleen Newport, M.D.               DATE OF BIRTH:  22-Sep-1945   DATE OF ADMISSION:  07/09/2002  DATE OF DISCHARGE:                                HISTORY & PHYSICAL   ADMISSION DIAGNOSES:  Spondylolisthesis L4-L5 with lumbar radiculopathy,  herniated nucleus pulposus, and spondylosis at L5-S1 with lumbar  radiculopathy.   HISTORY OF PRESENT ILLNESS:  [name] is a 65 year old individual whom I  evaluated in the past for herniated nucleus pulposus at L5-S1 on the right  side with right lumbar radiculopathy.  I had initially started following the  man in 1993 for complaints of back and leg pain.  He has had a longstanding  number of medical problems including colostomy for ulcerative colitis.  He  has had increasingly intolerable back pain and right lower extremity pain  with weakness in the right leg, particularly in the anterior aspect of both  thighs.  He had a workup with some x-rays in 2003, and more recently when I  saw him in May, he had x-rays this year that demonstrated that he had a  grade II spondylolisthesis at the level of L4-L5.  There is significant disk  herniation at L5-S1 previously noted from studies in 1995, and I have  suggested that he needed further MRI.  He had this performed, and I reviewed  it in the office a few weeks ago.  This demonstrated that he had a severe  spondylolisthesis with stenosis at L4-L5 and also spondylitic changes with  compression of the canal at L5-S1 because of moderate disease there.  He has  been using hydrocodone for pain control, and this has been working poorly.  After careful consideration of his options, I advised that he should undergo  two-level decompression with arthrodesis at L4-5 and at L5-S1.  This  will be  done through a posterior approach.  He is now being admitted for this  procedure.   PAST MEDICAL HISTORY:  Significantly notable for history of asthma.  He had  prostate surgery in 2000, hernia repair in 1995.  He has had an ileostomy  since 1965.  He uses hydrocodone for pain control.  Clonazepam helps him  sleep, and he uses Tylenol on a p.r.n. basis.   CURRENT MEDICATIONS:  Advair, Allegra, Singulair, Flonase, hydrocodone,  clonazepam, vitamin D, MSM, CoQ-10.   PRIMARY CARE PHYSICIAN:  He is followed by Dr. Benita Stabile and Phillip Heal.   SOCIAL HISTORY:  The patient does not smoke.  He does not drink alcohol.  Weight has been stable at 5 feet 9 inches and 215 pounds.   FAMILY HISTORY:  Notable for diabetes, cancer, heart disease, and  hypertension within the  family.   REVIEW OF SYSTEMS:  Notable that he wears glasses, has a history of  cataracts, nasal congestion and drainage, leg pain with walking, asthma,  arthritis, and back pain.  Prostate cancer, and kidney stones are also  notes.  He has indigestion and pain with eating secondary to gas.  His last  chest x-ray was in April 2004.   PHYSICAL EXAMINATION:  NEUROLOGIC:  He is alert and oriented individual.  He  stands straight and erect without too much difficulty,but he moves about  very slowly.  He is walking with a cane.  His motor strength in iliopsoas  and quads reveals 5/5 strength.  Tibialis anterior strength is slightly weak  at 4/5 bilaterally.  Reflexes are absent in the right patellae, 1+ left  patella, 1+ in both Achilles.  Babinski's are downgoing. Mobility in his  back reveals that he can flex forward at +70 degrees.  He extends normally.  Sensation is intact to pin and light touch in the distal lower extremities.  In the upper extremities, his strength is normal.  Reflexes are intact.  Cranial nerve examination reveals his pupils are 4 mm, briskly reactive to  light and accommodation.  Extraocular movements  are full.  The face is  symmetric to grimace.  Tongue and uvula are midline.  HEENT:  Sclerae and conjunctivae are clear.  Fundi reveal the disks to be  flat.  NECK:  Supple.  No masses are noted.  LUNGS:  Clear to auscultation.  HEART:  Regular rate and rhythm.  ABDOMEN:  Soft.  Bowel sounds are positive.  No masses are noted.  EXTREMITIES:  No cyanosis, clubbing, or edema.   IMPRESSION:  1. The patient has longstanding ileostomy secondary to ulcerative colitis.  2. 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   He is now being admitted to undergo surgery with decompression followed by  stabilization from L4 to sacrum.                                               Earleen Newport, M.D.    Drucilla Schmidt  D:  07/09/2002  T:  07/09/2002  Job:  [id number]

## 2010-06-05 NOTE — Discharge Summary (Signed)
[name], [name]               ACCOUNT NO.:  [id number]   MEDICAL RECORD NO.:  [id number]          PATIENT TYPE:  ORB   LOCATION:  4782                         FACILITY:  [facility]   PHYSICIAN:  Charlett Blake, M.D.DATE OF BIRTH:  1945-06-05   DATE OF ADMISSION:  10/18/2003  DATE OF DISCHARGE:  10/25/2003                                 DISCHARGE SUMMARY   DISCHARGE DIAGNOSES:  1.  Right total hip arthroplasty secondary to osteoarthritis, October 09, 2003.  2.  Pain management.  3.  Chronic atrial fibrillation, with Coumadin therapy.  4.  Asthma.  5.  Postoperative ileus after hip replacement, resolved.  6.  History of ulcerative colitis with ileostomy.  7.  Hypokalemia.   HISTORY OF PRESENT ILLNESS:  A 65 year old white male with history of atrial  fibrillation, on chronic Coumadin, admitted October 09, 2003 with end-  stage changes of the right hip, with no relief with conservative care.  Underwent a right total hip arthroplasty on October 09, 2003 per Dr.  French Ana.  Advised 50% partial weightbearing, Coumadin resumed, and cardiac  rate remained controlled.  Postoperative atrial flutter.  He did receive a  run of intravenous Cardizem.  Followup per [facility].  His  Lopressor was increased to 50 mg b.i.d.  He did have some nausea and  vomiting.  Gastroenterology consulted.  Findings of small ileus.  He was on  a nasogastric tube for a short time.  Diet advanced after being placed on  Reglan.   PAST MEDICAL HISTORY:  See discharge diagnoses.   ALLERGIES:  1.  PENICILLIN.  2.  MORPHINE.  3.  SULFA.  4.  E-MYCIN.   MEDICATIONS PRIOR TO ADMISSION:  Cartia; clonazepam; Coumadin; Singulair;  Flonase; Pulmicort; albuterol inhaler; and Vicodin.   SOCIAL HISTORY:  Lives with 46 year old father in [place].  One-level  home.  Two steps to entry.  Father with limited assistance.  No other local  family.   HOSPITAL COURSE:  Patient with progressive  gains while on rehab services,  with therapies initiated daily.  The following issues were followed during  the patient's rehab course.  Pertaining to [name] right total hip  arthroplasty, surgical site healing nicely.  Staples had been removed.  No  signs of infection.  He was partial weightbearing, with hip precautions.  He  would follow up with Dr. French Ana.  He continued on Vicodin on a limited  basis for pain and good results.  Chronic Coumadin ongoing for atrial  fibrillation, with cardiac rate controlled.  His Lopressor had recently been  increased per [facility] Cardiology.  His latest INR was 2.2.  He had initially  been followed by [facility] for his chronic Coumadin therapy.  He  had requested to be followed closer to his home in [place].  His primary  M.D. who is Dr. Benita Stabile, was contacted prior to the patient's departure  from the hospital to follow Coumadin therapy.  He did have a history of  asthma.  Oxygen saturations were greater than 90%.  He continued on his  Singulair  and albuterol inhaler.  Postoperative ileus resolved.  Bowel  program was regulated.  He was weaned from his Reglan.  He had a history of  ulcerative colitis.  He was independent with the care of his ileostomy bag.  Overall, for his functional mobility he was supervision ambulating with a  rolling walker, supervision bed mobility and transfers, simple setup for  activities of daily living.  Home health therapies had been arranged.   Latest labs showed an INR of 2.2, hemoglobin 11.9, hematocrit 34.4.  Sodium  134, potassium 3.9, BUN 9, creatinine 0.8.   DISCHARGE MEDICATIONS:  1.  Coumadin 4 mg daily.  2.  Lopressor 50 mg b.i.d.  3.  Potassium chloride 20 mEq daily.  4.  Nasonex 2 sprays daily.  5.  Reglan 10 mg a.c. and at bedtime x 1 month.  6.  Protonix 40 mg b.i.d.  7.  Aspirin 81 mg daily.  8.  Cardizem 180 mg daily.  9.  Vicodin as needed for pain.  10. Singulair 10 mg at  bedtime as needed.  11. Albuterol inhaler t.i.d. as needed.   ACTIVITY:  Partial weightbearing with hip precautions.   DIET:  Regular.   WOUND CARE:  Cleanse incision daily with soap and water.   SPECIAL INSTRUCTIONS:  Home health nurse for Coumadin therapy to check INR  on Monday October 28, 2003, with results to Dr. Benita Stabile of [place].  Home health physical and occupational therapy.  He should follow up with Dr.  French Ana, orthopedic services.       DA/MEDQ  D:  10/24/2003  T:  10/24/2003  Job:  [id number]   cc:   Charlett Blake, M.D.  [address]    [place] [address]  Fax: [phone]   Yvette Rack., M.D.  [place]  [place] [address]  Fax: [phone]   Eustace Quail, M.D. [facility]  [address]  [place] [address]  Fax: [phone]

## 2010-06-05 NOTE — Assessment & Plan Note (Signed)
[facility] OFFICE NOTE   NAME:[name], [name]                        MRN:          [id number]  DATE:05/10/2006                            DOB:          10/26/1945    PRIMARY CARE PHYSICIAN:  Dr. Benita Stabile, [place], [place].   CLINICAL COURSE:  [name] is 65 years old and has a history of  paroxysmal atrial flutter which has been managed medically with Toprol  and Cardizem. He saw Dr. Lovena Le in consultation about possible ablation  but they decided to continue medical therapy.   He has done quite well recently. He has had occasional palpitations but  he has had no prolonged episodes like his previous atrial flutter. He  has no symptoms of chest pain or shortness of breath.   PAST MEDICAL HISTORY:  Significant for a previous hip replacement and  back surgery. He is followed by [name] for his back.   CURRENT MEDICATIONS:  1. Flonase.  2. Toprol XL 1-1/2 tablets daily.  3. Cartia XT 180 mg daily.  4. Hydrocodone.   SOCIAL HISTORY:  He is disabled from his back and hip problems.   PHYSICAL EXAMINATION:  VITAL SIGNS:  Blood pressure 130/80 with pulse 77  and regular.  NECK:  There was no vein distention. The carotid pulses were full  without bruits.  CHEST:  Clear.  CARDIAC:  Rhythm was regular. I could hear no murmurs or gallops.  ABDOMEN:  Soft with normal bowel sounds. There was no  hepatosplenomegaly. Peripheral pulses were full with no peripheral  edema.   An electrocardiogram  was normal.   IMPRESSION:  1. Paroxysmal atrial flutter now controlled on rate control      medications.  2. Status post hip replacement.  3. Degenerative disease of the lumbar spine.  4. Disability.  5. Ulcerative colitis status post previous colectomy with colostomy.   RECOMMENDATIONS:  I think [name] is doing fairly well from the  standpoint of his heart. We did not put him on Coumadin because his [place]  score  was 0. We encouraged him to take an aspirin but he had some stomal  bleeding at his colostomy site and he stopped that. I  encouraged him to take a baby coated aspirin. Will continue his same  therapy and I will see him back in a year.     Bruce Alfonso Patten Olevia Perches, MD, [facility]  Electronically Signed    BRB/MedQ  DD: 05/10/2006  DT: 05/10/2006  Job #: [id number]

## 2010-06-05 NOTE — Consult Note (Signed)
NAMECLEVESTER, HELZER                           ACCOUNT NO.:  [id number]   MEDICAL RECORD NO.:  [id number]                   PATIENT TYPE:  INP   LOCATION:  3110                                 FACILITY:  [facility]   PHYSICIAN:  Eustace Quail, M.D.                  DATE OF BIRTH:  06-May-1945   DATE OF CONSULTATION:  07/13/2002  DATE OF DISCHARGE:                                   CONSULTATION   REFERRING PHYSICIAN:  Earleen Newport, M.D. and Dr. Lyda Jester.   REASON FOR REFERRAL:  Evaluation and management of supraventricular  tachycardia.   CHIEF COMPLAINT:  Palpitations.   CLINICAL HISTORY:  Mr. Wirz is 65 years old and on June 21 had back  surgery for spondylolisthesis at L4, L5.  Postoperatively, he developed an  ileus and then tachycardia, initially sinus tachycardia and now  supraventricular tachycardia at a rate of 170.  He was seen in consultation  by Dr. Joya Gaskins who treated him with Cardizem 10 mg bolus and a drip.  His  pressure dropped down in the 90s and this precluded increasing his dose of  Cardizem for the short term.  The patient has symptoms of palpitations, but  no chest pain or shortness of breath.   His past medical history is significant for asthma and he had prostate  cancer surgery in 2000.  He also has a history of ulcerative colitis and is  status post colectomy and has a colostomy.  He also has a history of kidney  stones.   His medications prior to admission include Advair, Allegra,  Singulair,  Flonase, hydrocodone, vitamin D and CoQ10.  He is currently receiving  Advair, Reglan, Protonix and Cardizem drip.   For details of social history,  family history and review of systems, please  see Dr. Trinidad Curet complete note.   PHYSICAL EXAMINATION:  VITAL  SIGNS:  Blood pressure 98/56 and pulse 170 and  regular.  O2 saturations were 96%.  NECK:  There was no definitive venous distention.  The carotid pulses were  full without bruits.  CHEST:  Clear  without wheezes or rales or rhonchi.  CARDIAC:  Rhythm was regular.  Heart sounds were normal.  No murmurs or  gallops.  ABDOMEN:  There was a colostomy in place.  The abdomen was soft and  nontender without hepatosplenomegaly.  EXTREMITIES:  Showed good pulses and there was no trace peripheral edema at  the ankles.  MUSCULOSKELETAL:  Showed no deformities.  SKIN:  Warm and dry.  NEUROLOGIC:  Nonfocal.   His EKG showed supraventricular tachycardia with ventricular rate of 170 and  probable flutter waves.  EKG showed poor R-wave progression and questionable  inferior Q-waves.  His TSH was normal and his MBs and troponins were  negative.  His renal function was normal.   IMPRESSION:  1. Supraventricular tachycardia, probably atrial flutter with 2:1 block.  2. Status post lumbar spine surgery.  3. Postoperative ileus.  4. Asthma.  5. History of ulcerative colitis, status post colectomy with colostomy.   RECOMMENDATIONS:  I agree with the treatment up to the present time with  Cardizem bolus and Cardizem drip.  Since his blood pressure is low we will  add dig to see if we can slow him down.  I am not 100% sure if this is  flutter or SVT and Adenocard may be diagnostic and possibly therapeutic and  will try this.  In the meantime, we will give IV fluid in hope of raising  his blood pressure so we can increase his Cardizem drip to try and slow his  rate.  He has been in the atrial flutter longer than 24 hours so D/C  cardioversion is probably not the best option as long as he is tolerating  the rhythm well.                                               Eustace Quail, M.D.    BB/MEDQ  D:  07/13/2002  T:  07/15/2002  Job:  [id number]

## 2010-06-05 NOTE — Op Note (Signed)
[name], [name]               ACCOUNT NO.:  [id number]   MEDICAL RECORD NO.:  [id number]          PATIENT TYPE:  INP   LOCATION:  5020                         FACILITY:  [facility]   PHYSICIAN:  Yvette Rack., M.D.DATE OF BIRTH:  [birth year]   DATE OF PROCEDURE:  10/09/2003  DATE OF DISCHARGE:                                 OPERATIVE REPORT   PREOPERATIVE DIAGNOSIS:  Severe osteoarthritis, right hip.   POSTOPERATIVE DIAGNOSIS:  Severe osteoarthritis, right hip.   OPERATION:  Right total hip replacement (DePuy Prodigy fully porous-coated  stem with plus 17 mm neck length, 32 head, 52 mm acetabulum, 100 series cup  with cross-link polyethylene.   SURGEON:  [name], M.D.   ASSISTANT:  Thomasenia Bottoms, PA-C.   BLOOD LOSS:  Approximately 250.   DESCRIPTION OF PROCEDURE:  Sterilely prepped and draped in lateral position.  Posterior approach to the hip made with careful protection of the sciatic  nerve, cutting of the head about 1 fingerbreadth above the lesser troch  followed by progressive reaming up to 13 and actually partially to 13.5  reamed to accept the fully porous-coated stem.  Rasping was done with calcar  reaming as well followed by exposure of the acetabulum, and significant  osteoarthritis was encountered.  Reaming was accomplished up to a 51 mm size  to accept a 52 mm cup with about 15-20 degrees of anteversion, 45 degrees of  abduction.  Trial cup 50 bottomed out easily.  The 52 did not.  The final  cup was inserted with the trial liner followed by progressive trial with the  broach up to plus 17 mm which seemed to provide the most stability, restore  leg lengths equally without any undue tension on the sciatic nerve.  Final  acetabulum was inserted followed by the stem.  Again trial neck lengths  tried.  Again, the most optimal restoration of stability with length was the  plus 17 mm neck length.  Hip was reduced.  It seemed to be stable except at  extreme  flexion, adduction, internal rotation.  The capsule labral complex  was repaired with #1 Ethibond and #1 Vicryl, the subcutaneous tissue with 2-  0 Vicryl and the skin with the stapling device.  Compression sterile  dressing was applied with Marcaine in the incision, taken to the recovery  room in stable condition.      WDC/MEDQ  D:  10/09/2003  T:  10/09/2003  Job:  [id number]

## 2010-06-05 NOTE — Discharge Summary (Signed)
[name], [name]               ACCOUNT NO.:  [id number]   MEDICAL RECORD NO.:  [id number]          PATIENT TYPE:  INP   LOCATION:  3151                         FACILITY:  [facility]   PHYSICIAN:  Lockie Pares, M.D.    DATE OF BIRTH:  16-Apr-1945   DATE OF ADMISSION:  10/09/2003  DATE OF DISCHARGE:  10/18/2003                                 DISCHARGE SUMMARY   IDENTIFYING INFORMATION:  Discharged to rehab.   ADMISSION DIAGNOSES:  1.  Right hip end-stage osteoarthritis.  2.  Chronic atrial fibrillation on Coumadin.  3.  Coumadin therapy due to atrial fibrillation.  4.  History of asthma.  5.  History of ileostomy due to ulcerative colitis.  6.  History of previous ileus with IV morphine.  7.  History of prostate cancer.   DISCHARGE DIAGNOSES:  1.  Right total hip arthroplasty.  2.  Postoperative atrial flutter converting into sinus tachycardia.  3.  Postoperative ileus.  4.  Hypokalemia.  5.  History of chronic atrial fibrillation on chronic Coumadin therapy.  6.  History of asthma.  7.  History of ileostomy secondary to ulcerative colitis.  8.  History of left eye cataract.  9.  History of prostate cancer.   HISTORY OF PRESENT ILLNESS:  The patient is a 65 year old white male who  presents with a 2-year history of gradual onset progressively worsening  right hip pain.  No previous injury or surgical procedures done on the leg.  He has constant soreness over the lateral anterior thigh which radiates down  to the knee.  It worsens with activity and range of motion.  He is currently  using Vicodin for relief.  He is currently using a cane over the last year  to assist ambulation.   ALLERGIES:  1.  PENICILLIN.  2.  SULFA.  3.  ERYTHROMYCIN.  4.  MORPHINE.   CURRENT MEDICATIONS:  1.  Cartia XT 180 mg p.o. daily.  2.  Clonazepam 0.5 mg 1/2 tablet p.o. q.h.s. p.r.n.  3.  Coumadin 5 mg daily.  Stopped on September 14th.  4.  Singulair 10 mg p.o. daily p.r.n.  5.  Flonase 50  mcg two sprays each naris q.a.m.  6.  Pulmicort 200 mcg two puffs daily.  7.  Albuterol 17 g two puffs daily p.r.n.  8.  Vicodin 1/2 to 1 tablet p.o. q.4 h. p.r.n.  9.  Flax oil omega-3 two tablespoons daily.   SURGICAL PROCEDURE:  On October 09, 2003, the patient was taken to the OR  by Dr. Earlie Server assisted by Benita Stabile, P.A.-C.  Under general  anesthesia the patient underwent a right total hip arthroplasty with the  following components.  A size 13.5 hip stem, a 32 mm inside diameter and 52  mm outside diameter acetabular liner, a 32 mm diameter a plus 17 femoral  ball and an Apex __________ and a size 55 mm acetabular cup.  The patient  tolerated the procedure well and was transferred to the recovery room and  then to the orthopedic floor in good condition for routine total hip  protocol.   CONSULTS:  1.  The following routine consults requested:  Physical therapy,      occupational therapy, rehab.  2.  A cardiology consult with [facility] was requested for evaluation of the      patient's new onset atrial flutter.  3.  A GI consult was requested for evaluation of the patient's nausea,      vomiting, and ileus.   HOSPITAL COURSE:  On October 09, 2003, the patient was admitted to [facility] under the care of Dr. Earlie Server for a right total hip  arthroplasty.  The patient underwent this procedure without any  complications, tolerated well, and was transferred to the recovery room on  an IV PCA with Dilaudid and started on Coumadin for DVT prophylaxis and for  routine total hip protocol.  On postop day #1, the patient had no complaints  other than just some soreness in his thigh and hip.  He had no shortness of  breath or chest discomfort.  His vital signs were stable.  He had some mild  postoperative blood loss but he was able to tolerate it well.  His  chemistries were stable.  His wound was benign for any signs of infections.  His leg remained motor  vascular intact so the patient was going to proceed  with total hip protocol but later in that evening the patient complained of  some nausea with chest soreness around into the back.  He was evaluated and  found to have atrial flutter on EKG at a rate of 160 and a cardiac consult  was requested for further evaluation.  The patient was initially evaluated  and was given a bolus of diltiazem 60 mg and then started on a diltiazem  drip at 10 mg and was transferred to the telemetry floor.  The patient  remained in atrial flutter for, I believe, just short of 48 hours when he  converted over to sinus rhythm tachycardia.  He also had his diltiazem rate  increased to 15 mg an hour.  The patient during this time his vital signs  otherwise remained stable.  He had no other cardiac related complaints.  No  shortness of breath or discomfort but he did develop some significant nausea  and vomiting and was initially treated with clear liquids and antiemetics  but this continued, so he was evaluated by GI services and felt to have an  ileus.  During their evaluations, they made him NPO.  He was given an NG  tube and over the next 24 to 48 hours the patient's symptoms improved  significantly and he had gradual improvement of bowel sounds.  The NG tube  was able to be clamped off.  He was able to tolerate clear liquids without  any further progression of nausea and vomiting and he was then slowly  transitioned back over to p.o. meds as tolerated.  Due to the amount of  coffee-ground emesis, the patient also had his Coumadin stopped by GI  services and then once the patient was able to tolerate p.o. meds he was  placed on a PPI and had his Coumadin restarted.   During the patient's continued postoperative care, the patient remained  tachycardic in sinus tachycardia so cardiology added a beta-blocker to his medications and was able to transition him over to p.o. meds including the  beta-blocker and  Cardizem.   Over the next several days, the patient had no further complaints.  He was  able  to tolerate a diet well without any nausea or vomiting.  His heart rate  remained fairly well controlled in the low 100s and he was cleared by  cardiology and GI services to resume a rehab course and his activities were  increased.  Due to the patient's multiple issues during this  hospitalization, he was felt to require some rehab services prior to being  discharged to home to help gain strength and independence, so he was  evaluated by their services and he was accepted once cleared by cardiology  and GI services.   Due to the patient's multiple episodes of emesis and hydration, the patient  did have some hypokalemia and this was improved with initially IV and then  p.o. potassium replacements.   On postop day #9, it was felt from GI services, cardiac services, and from  orthopedic services the patient was medically stable and ready for transfer  to rehab services.  Thus so he was accepted and transferred to that unit in  good condition.   LABORATORY DATA:  EKG on September 23rd was sinus tachycardia with possible  left atrial enlargement, ST wave abnormalities consider lateral ischemia at  113 beats per minute.   Chest x-ray on September 21st showed chronic changes, no active disease.   Chest x-ray on September 23rd shows slight bibasilar atelectasis with  abdominal x-ray on September 24th showing severe distention of the structure  in the left upper quadrant favored to represent the stomach, less likely  would be the transverse colon, NG placement is recommended with a repeat  film on September 25th, dilated small bowel consistent with partial small-  bowel obstruction and it showed as decompressed prior gastric distention.   CBC on September 29th:  WBC is 14.4, hemoglobin 11.4, hematocrit 33.5,  platelets 422.   INR on September 30th is 1.1.   Routine chemistries on September 29th:   Sodium of 135, potassium 3.1 down  from 3.8 the day previous with p.o. replacements being given.  Glucose 147  labile between 107 the day previous and 161, as high as 236 three days  previous was felt to be due to inactivity and diet changes, will monitor.  BUN 12, creatinine 0.8.   BNP on September 29th was less than 30 with a TSH of 1.453 and a free T4 of  1.27.  Urinalysis on September 16th was negative.  Blood cultures on  September 22th was no growth after 5 days.   MEDICATIONS UPON DISCHARGE FROM TELEMETRY FLOOR:  1.  Trinsicon one tablet p.o. t.i.d.  2.  Enema of choice p.r.n.  3.  Heparin 3000 units subcu q.12 h.  4.  Vicodin 7.5 mg one or two tablets every 4-6 hours p.r.n.  5.  Tylenol 650 mg p.o. q.4 h. p.r.n.  6.  Robaxin 500 mg p.o. q.6 h. p.r.n.  7.  Reglan 10 mg p.o. q.8 h. p.r.n.  8.  Singulair 10 mg p.o. daily p.r.n.  9.  Albuterol two puffs p.r.n. 10. Nasonex two sprays daily.  11. Protonix 40 mg p.o. daily.  12. Diltiazem 30 mg p.o. q.i.d.  13. Aspirin 325 mg p.o. daily.  14. Diltiazem 30 mg p.o. q.i.d. was discontinued to 180 mg p.o. daily.  15. Lopressor 25 mg p.o. b.i.d.  16. Potassium chloride 20 mEq p.o. b.i.d.  17. Lopressor mentioned above 25 mg p.o. b.i.d. was changed to 50 mg p.o.      b.i.d.   DISCHARGE INSTRUCTIONS:  1.  Medications:  The patient  is to continue meds as dispensed on orthopedic      floor.  2.  Activity:  The patient may be 50% weightbearing with his surgical hip      with the use of a walker and close supervision with ambulation and the      use of a walker.  3.  Wound Care:  The patient should have wound checks daily for any signs of      infection.  Staples removed on postop day #14.  4.  Diet:  No restrictions.  5.  Follow Up:  The patient should have follow up appointment with Dr.      French Ana in his office one week from discharge from rehab services.  6.  The patient should also arrange for a follow up appointment with his       cardiologist and GI doctor upon discharge for evaluation of his      hospitalized issues.      Okey Regal   RWK/MEDQ  D:  11/12/2003  T:  11/12/2003  Job:  [id number]   cc:   Dr. Olevia Perches

## 2010-06-05 NOTE — Discharge Summary (Signed)
   NAMEKHALEE, MAZO                           ACCOUNT NO.:  [id number]   MEDICAL RECORD NO.:  [id number]                   PATIENT TYPE:  INP   LOCATION:  6389                                 FACILITY:  [facility]   PHYSICIAN:  Earleen Newport, M.D.               DATE OF BIRTH:  [birth year]   DATE OF ADMISSION:  07/09/2002  DATE OF DISCHARGE:  07/17/2002                                 DISCHARGE SUMMARY   ADMISSION DIAGNOSIS:  Lumbar spondylolisthesis, L4-5, L5-S1 with lumbar  radiculopathy.   DISCHARGE DIAGNOSES:  1. Lumbar spondylolisthesis, L4-5, L5-S1 with lumbar radiculopathy.  2. Atrial fibrillation and atrial flutter.   CONSULTATIONS:  Critical care medicine and cardiology.   CONDITION ON DISCHARGE:  Improved.   HOSPITAL COURSE:  Mr. Amare Bail is a 65 year old individual who has had  significant back and bilateral leg pain, who was found to have a  spondylolisthesis and had been followed conservatively for a period of over  10 years.  He was failing, having increasing pain and weakness in his left  extremities and was advised regarding surgical decompression of two lower  levels of his lumbar spine.  This was performed on July 09, 2002.  Postoperatively, the patient had a fair amount of back pain.  He was slow to  mobilize.  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  Ultimately, the patient required  placement of an NG tube and hydration.  During this time, the patient  developed a bout of first what was thought to be supraventricular  tachycardia and then it was found that it actually represented atrial  flutter.  He was treated with diltiazem drip and ultimately converted back  to a normal sinus rhythm.  He was seen in concert with critical care  management and cardiology who helped regulate his rate.  At the current  time, he is being maintained on Cardizem orally.  He has been given a  followup for the  cardiologist.  He will be seen by myself in approximately  three weeks' time.  He has his pain reasonably well controlled with the use  of methocarbamol as a muscle relaxer and hydrocodone for pain.                                               Earleen Newport, M.D.    Drucilla Schmidt  D:  07/16/2002  T:  07/16/2002  Job:  [id number]

## 2010-07-02 ENCOUNTER — Encounter: Payer: Self-pay | Admitting: Cardiovascular Disease

## 2010-12-04 ENCOUNTER — Ambulatory Visit (INDEPENDENT_AMBULATORY_CARE_PROVIDER_SITE_OTHER): Payer: Medicare Other | Admitting: Internal Medicine

## 2010-12-04 ENCOUNTER — Encounter: Payer: Self-pay | Admitting: Internal Medicine

## 2010-12-04 VITALS — BP 118/64 | HR 66 | Ht 69.0 in | Wt 223.0 lb

## 2010-12-04 DIAGNOSIS — I10 Essential (primary) hypertension: Secondary | ICD-10-CM

## 2010-12-04 DIAGNOSIS — R002 Palpitations: Secondary | ICD-10-CM

## 2010-12-04 DIAGNOSIS — I4892 Unspecified atrial flutter: Secondary | ICD-10-CM

## 2010-12-04 NOTE — Patient Instructions (Signed)
Your physician recommends that you schedule a follow-up appointment in: 1 year  

## 2010-12-04 NOTE — Assessment & Plan Note (Signed)
His blood pressure remains well controlled. He will continue his current meds, maintain a low sodium diet and lose weight.

## 2010-12-04 NOTE — Assessment & Plan Note (Signed)
He appears to be maintaining NSR. He will continue his current meds.

## 2010-12-04 NOTE — Progress Notes (Signed)
HPI [name] returns today for followup. He is a pleasant 65 yo man with a h/o palpitations and rare Parox. Atrial flutter. In the last year he has been bothered by back problems but has only had one or two episodes of atrial flutter. He denies syncope, near syncope or c/p.  Allergies  Allergen Reactions  . Erythromycin     rash  . Morphine     Feels crazy & itching  . Penicillins     Rash   . Sulfonamide Derivatives     Rash & flushed     Current Outpatient Prescriptions  Medication Sig Dispense Refill  . ALPRAZolam (XANAX) 0.25 MG tablet Take 0.25 mg by mouth at bedtime as needed.        [name] CINNAMON PO Take 900 mg by mouth daily.        [name] diltiazem (DILACOR XR) 240 MG 24 hr capsule Take 1 capsule (240 mg total) by mouth daily.  30 capsule  6  . fluticasone-salmeterol (ADVAIR HFA) 115-21 MCG/ACT inhaler Inhale 2 puffs into the lungs 2 (two) times daily.        . metoprolol (LOPRESSOR) 50 MG tablet Take 1 tablet (50 mg total) by mouth as directed. 1 1/2 tablets two times a day.  90 tablet  6  . POTASSIUM CITRATE PO Take 99 mg by mouth daily.        . Thiamine HCl (VITAMIN B-1) 100 MG tablet Take 100 mg by mouth daily.        . traMADol (ULTRAM) 50 MG tablet Take 50 mg by mouth daily.           Past Medical History  Diagnosis Date  . Palpitations   . Atrial flutter     well controlled with medical therapy  . Nonsustained ventricular tachycardia     no clinical significance at present  . S/P hip replacement     remote  . Asthma   . Prostate cancer     ROS:   All systems reviewed and negative except as noted in the HPI.   History reviewed. No pertinent past surgical history.   Family History  Problem Relation Age of Onset  . Heart attack Father      History   Social History  . Marital Status: Single    Spouse Name: N/A    Number of Children: N/A  . Years of Education: N/A   Occupational History  . Not on file.   Social History Main Topics  . Smoking  status: Former Smoker -- 3.0 packs/day for 10 years    Types: Cigarettes    Quit date: 12/03/1988  . Smokeless tobacco: Not on file  . Alcohol Use: 0.5 oz/week    1 drink(s) per week  . Drug Use: No  . Sexually Active: Not on file   Other Topics Concern  . Not on file   Social History Narrative  . No narrative on file     BP 118/64  Pulse 66  Ht 5' 9"  (1.753 m)  Wt 101.152 kg (223 lb)  BMI 32.93 kg/m2  Physical Exam:  Well appearing NAD HEENT: Unremarkable Neck:  No JVD, no thyromegally Lymphatics:  No adenopathy Back:  No CVA tenderness Lungs:  Clear with no wheezes, rales, or rhonchi. HEART:  Regular rate rhythm, no murmurs, no rubs, no clicks Abd:  soft, positive bowel sounds, no organomegally, no rebound, no guarding Ext:  2 plus pulses, no edema, no cyanosis, no clubbing Skin:  No rashes no nodules Neuro:  CN II through XII intact, motor grossly intact  EKG NSR   Assess/Plan:

## 2010-12-14 ENCOUNTER — Other Ambulatory Visit: Payer: Self-pay

## 2010-12-14 MED ORDER — DILTIAZEM HCL ER 240 MG PO CP24: 240.0000 mg | ORAL_CAPSULE | Freq: Every day | ORAL | Status: DC

## 2011-07-12 ENCOUNTER — Other Ambulatory Visit: Payer: Self-pay | Admitting: *Deleted

## 2011-07-12 MED ORDER — DILTIAZEM HCL ER 240 MG PO CP24: 240.0000 mg | ORAL_CAPSULE | Freq: Every day | ORAL | Status: DC

## 2011-10-01 ENCOUNTER — Ambulatory Visit: Payer: Self-pay | Admitting: Urology

## 2011-10-05 ENCOUNTER — Ambulatory Visit: Payer: Self-pay | Admitting: Urology

## 2012-02-15 ENCOUNTER — Encounter: Payer: Self-pay | Admitting: Internal Medicine

## 2012-02-15 ENCOUNTER — Ambulatory Visit (INDEPENDENT_AMBULATORY_CARE_PROVIDER_SITE_OTHER): Payer: Medicare Other | Admitting: Internal Medicine

## 2012-02-15 VITALS — BP 110/68 | HR 67 | Ht 69.0 in | Wt 204.8 lb

## 2012-02-15 DIAGNOSIS — R079 Chest pain, unspecified: Secondary | ICD-10-CM

## 2012-02-15 DIAGNOSIS — R0602 Shortness of breath: Secondary | ICD-10-CM

## 2012-02-15 DIAGNOSIS — I10 Essential (primary) hypertension: Secondary | ICD-10-CM

## 2012-02-15 DIAGNOSIS — R002 Palpitations: Secondary | ICD-10-CM

## 2012-02-15 NOTE — Assessment & Plan Note (Signed)
His blood pressure today is improved. As he continues to lose weight, he may be able to get off of one of his AV nodal blocking agents.

## 2012-02-15 NOTE — Progress Notes (Signed)
HPI [name] returns today for followup. He is a very pleasant 67 year old man with a history of obesity, hypertension, palpitations do to nonsustained VT, who returns for arrhythmia followup. In the interim, he has had no syncope. He has minimal nonexertional chest pain. He has lost approximately 20 pounds in the last year. He notes that he is trying to eat less. He has continued to stop smoking. Allergies  Allergen Reactions  . Erythromycin     rash  . Morphine     Feels crazy & itching  . Penicillins     Rash   . Sulfonamide Derivatives     Rash & flushed     Current Outpatient Prescriptions  Medication Sig Dispense Refill  . Ascorbic Acid (VITAMIN C) 1000 MG tablet Take 1,000 mg by mouth 2 (two) times daily.      [name] diltiazem (DILACOR XR) 240 MG 24 hr capsule Take 1 capsule (240 mg total) by mouth daily.  30 capsule  11  . Garlic 828 MG TABS Take by mouth. 2 tablets twice daily.      [name] HAWTHORN PO Take 225 mg by mouth 2 (two) times daily.      . Iodine, Kelp, (KELP PO) Take 600 mg by mouth 2 (two) times daily.      . metoprolol (LOPRESSOR) 50 MG tablet Take 1 tablet (50 mg total) by mouth as directed. 1 1/2 tablets two times a day.  90 tablet  6  . Tamsulosin HCl (FLOMAX) 0.4 MG CAPS as needed.       . Taurine 500 MG CAPS Take by mouth daily.      . Thiamine HCl (VITAMIN B-1) 100 MG tablet Take 100 mg by mouth daily.        . traMADol (ULTRAM) 50 MG tablet Take 50 mg by mouth daily.        [name] MIXJECT 22.5 MG injection          Past Medical History  Diagnosis Date  . Palpitations   . Atrial flutter     well controlled with medical therapy  . Nonsustained ventricular tachycardia     no clinical significance at present  . S/P hip replacement     remote  . Asthma   . Prostate cancer     ROS:   All systems reviewed and negative except as noted in the HPI.   History reviewed. No pertinent past surgical history.   Family History  Problem Relation Age of  Onset  . Heart attack Father      History   Social History  . Marital Status: Single    Spouse Name: N/A    Number of Children: N/A  . Years of Education: N/A   Occupational History  . Not on file.   Social History Main Topics  . Smoking status: Former Smoker -- 3.0 packs/day for 10 years    Types: Cigarettes    Quit date: 12/03/1988  . Smokeless tobacco: Not on file  . Alcohol Use: 0.5 oz/week    1 drink(s) per week  . Drug Use: No  . Sexually Active: Not on file   Other Topics Concern  . Not on file   Social History Narrative  . No narrative on file     BP 110/68  Pulse 67  Ht 5' 9"  (1.753 m)  Wt 204 lb 12 oz (92.874 kg)  BMI 30.24 kg/m2  Physical Exam:  Well appearing 67 year old man, NAD HEENT: Unremarkable Neck:  No JVD, no thyromegally Lungs:  Clear with no wheezes, rales, or rhonchi. HEART:  Regular rate rhythm, no murmurs, no rubs, no clicks Abd:  soft, positive bowel sounds, no organomegally, no rebound, no guarding Ext:  2 plus pulses, no edema, no cyanosis, no clubbing Skin:  No rashes no nodules Neuro:  CN II through XII intact, motor grossly intact  EKG Normal sinus rhythm Assess/Plan:

## 2012-02-15 NOTE — Assessment & Plan Note (Signed)
He had no symptomatic atrial arrhythmias. He will undergo watchful waiting.

## 2012-02-15 NOTE — Patient Instructions (Addendum)
Your physician wants you to follow-up in: 1 year with Dr. Lovena Le. You will receive a reminder letter in the mail two months in advance. If you don't receive a letter, please call our office to schedule the follow-up appointment.

## 2012-07-11 ENCOUNTER — Other Ambulatory Visit: Payer: Self-pay | Admitting: Emergency Medicine

## 2012-07-11 MED ORDER — DILTIAZEM HCL ER 240 MG PO CP24: 240.0000 mg | ORAL_CAPSULE | Freq: Every day | ORAL | Status: DC

## 2013-02-20 ENCOUNTER — Ambulatory Visit (INDEPENDENT_AMBULATORY_CARE_PROVIDER_SITE_OTHER): Payer: Medicare HMO | Admitting: Internal Medicine

## 2013-02-20 ENCOUNTER — Encounter: Payer: Self-pay | Admitting: Internal Medicine

## 2013-02-20 VITALS — BP 82/62 | HR 54 | Ht 69.5 in | Wt 207.0 lb

## 2013-02-20 DIAGNOSIS — I4892 Unspecified atrial flutter: Secondary | ICD-10-CM

## 2013-02-20 DIAGNOSIS — I472 Ventricular tachycardia, unspecified: Secondary | ICD-10-CM

## 2013-02-20 DIAGNOSIS — I9589 Other hypotension: Secondary | ICD-10-CM

## 2013-02-20 DIAGNOSIS — I4729 Other ventricular tachycardia: Secondary | ICD-10-CM

## 2013-02-20 MED ORDER — METOPROLOL TARTRATE 25 MG PO TABS: 25.0000 mg | ORAL_TABLET | Freq: Two times a day (BID) | ORAL | Status: DC

## 2013-02-20 NOTE — Patient Instructions (Signed)
Your physician wants you to follow-up in: 6 months. You will receive a reminder letter in the mail two months in advance. If you don't receive a letter, please call our office to schedule the follow-up appointment.  Your physician has recommended you make the following change in your medication:  Decrease Metoprolol to 25 mg twice daily

## 2013-02-20 NOTE — Assessment & Plan Note (Signed)
The patient has recurrent atrial flutter. I've asked him to consider catheter ablation. The potential benefits would include the need to stop the medications which are contributing to hypotension, allow for the use of renal protection for his diabetes with an ACE inhibitor, I have given him permission from the [employer] of cardiology and he will let us know what he would choose to do. In the event that he would like to pursue catheter ablation, I will asked Dr. Lovena Le to like to do as he knows the patient better

## 2013-02-20 NOTE — Progress Notes (Signed)
.  kf      Patient Care Team: Morton Peters., MD as PCP - General (Unknown Physician Specialty)   HPI  [name] is a 68 y.o. male With a history of atrial flutter identified initially following back surgery in 2004 a clinical recurrence of which is noted in 2012. It was relatively typical. He has had few clinical recurrences.  Echocardiogram 2007I am he is on demonstrated normal LV function and no significant valvular or structural abnormalities  Functional status is limited more by his back then by breathlessness.  He denies chest pain. He's had no intercurrent lateralizing neurological symptoms.  He is not taking anticoagulation  He denies dizziness or orthostatic symptoms  The chart is replete with a diagnosis of nonsustained ventricular tachycardia which was described as "no clinical significance"  Past Medical History  Diagnosis Date  . Palpitations   . Atrial flutter     well controlled with medical therapy  . Nonsustained ventricular tachycardia     no clinical significance at present  . S/P hip replacement     remote  . Asthma   . Prostate cancer   . Diabetes mellitus without complication   . Depression     History reviewed. No pertinent past surgical history.  Current Outpatient Prescriptions  Medication Sig Dispense Refill  . Ascorbic Acid (VITAMIN C) 1000 MG tablet Take 1,000 mg by mouth 2 (two) times daily.      . beclomethasone (QVAR) 80 MCG/ACT inhaler Inhale 2 puffs into the lungs 2 (two) times daily.      . clorazepate (TRANXENE) 3.75 MG tablet Take 3.75 mg by mouth 2 (two) times daily as needed for anxiety.      [name] diltiazem (DILACOR XR) 240 MG 24 hr capsule Take 1 capsule (240 mg total) by mouth daily.  30 capsule  11  . metFORMIN (GLUCOPHAGE) 500 MG tablet Take 250 mg by mouth 2 (two) times daily with a meal.      . metoprolol (LOPRESSOR) 50 MG tablet Take 1 tablet (50 mg total) by mouth as directed. 1 1/2 tablets two times a day.  90  tablet  6  . Multiple Vitamin (MULTIVITAMIN) tablet Take 1 tablet by mouth daily.      . Thiamine HCl (VITAMIN B-1) 250 MG tablet Take 250 mg by mouth daily.      [name] MIXJECT 22.5 MG injection       . venlafaxine (EFFEXOR) 37.5 MG tablet Take 37.5 mg by mouth daily.       No current facility-administered medications for this visit.    Allergies  Allergen Reactions  . Erythromycin     rash  . Morphine     Feels crazy & itching  . Penicillins     Rash   . Sulfonamide Derivatives     Rash & flushed    Review of Systems negative except from HPI and PMH  Physical Exam BP 82/62  Pulse 54  Ht 5' 9.5" (1.765 m)  Wt 207 lb (93.895 kg)  BMI 30.14 kg/m2 Well developed and well nourished in no acute distress HENT normal E scleral and icterus clear Neck Supple JVP flat; carotids brisk and full Clear to ausculation  Regular rate and rhythm, no murmurs gallops or rub Soft with active bowel sounds No clubbing cyanosis none Edema Alert and oriented, grossly normal motor and sensory function although he walks with a stick Skin Warm and Dry    Assessment and  Plan

## 2013-02-20 NOTE — Assessment & Plan Note (Signed)
Described but not seen in a thorough review of the chart

## 2013-02-20 NOTE — Assessment & Plan Note (Signed)
His medications and being used for his arrhythmia. We will decrease his metoprolol from 75--25 mg twice daily.

## 2013-07-06 ENCOUNTER — Other Ambulatory Visit: Payer: Self-pay

## 2013-07-06 MED ORDER — DILTIAZEM HCL ER 240 MG PO CP24: 240.0000 mg | ORAL_CAPSULE | Freq: Every day | ORAL | Status: DC

## 2013-08-21 ENCOUNTER — Encounter: Payer: Self-pay | Admitting: Internal Medicine

## 2013-08-21 ENCOUNTER — Ambulatory Visit (INDEPENDENT_AMBULATORY_CARE_PROVIDER_SITE_OTHER): Payer: Medicare HMO | Admitting: Internal Medicine

## 2013-08-21 ENCOUNTER — Encounter: Payer: Self-pay | Admitting: *Deleted

## 2013-08-21 VITALS — BP 120/66 | HR 64 | Ht 69.0 in | Wt 195.5 lb

## 2013-08-21 DIAGNOSIS — I4729 Other ventricular tachycardia: Secondary | ICD-10-CM

## 2013-08-21 DIAGNOSIS — I4892 Unspecified atrial flutter: Secondary | ICD-10-CM

## 2013-08-21 DIAGNOSIS — R079 Chest pain, unspecified: Secondary | ICD-10-CM

## 2013-08-21 DIAGNOSIS — I472 Ventricular tachycardia, unspecified: Secondary | ICD-10-CM

## 2013-08-21 NOTE — Patient Instructions (Addendum)
[name]  Your caregiver has ordered a Stress Test with nuclear imaging. The purpose of this test is to evaluate the blood supply to your heart muscle. This procedure is referred to as a "Non-Invasive Stress Test." This is because other than having an IV started in your vein, nothing is inserted or "invades" your body. Cardiac stress tests are done to find areas of poor blood flow to the heart by determining the extent of coronary artery disease (CAD). Some patients exercise on a treadmill, which naturally increases the blood flow to your heart, while others who are  unable to walk on a treadmill due to physical limitations have a pharmacologic/chemical stress agent called Lexiscan . This medicine will mimic walking on a treadmill by temporarily increasing your coronary blood flow.   Please note: these test may take anywhere between 2-4 hours to complete  PLEASE REPORT TO [facility] AT THE FIRST DESK WILL DIRECT YOU WHERE TO GO  Date of Procedure:___________8/10/15__________________________  Arrival Time for Procedure:_______0945 am_______________________  Instructions regarding medication:   __x__ : Hold diabetes medication morning of procedure    PLEASE NOTIFY THE OFFICE AT LEAST 24 HOURS IN ADVANCE IF YOU ARE UNABLE TO KEEP YOUR APPOINTMENT.  [phone] AND  PLEASE NOTIFY NUCLEAR MEDICINE AT [facility] AT LEAST 24 HOURS IN ADVANCE IF YOU ARE UNABLE TO KEEP YOUR APPOINTMENT. [phone]  How to prepare for your Myoview test:  1. Do not eat or drink after midnight 2. No caffeine for 24 hours prior to test 3. No smoking 24 hours prior to test. 4. Your medication may be taken with water.  If your doctor stopped a medication because of this test, do not take that medication. 5. Ladies, please do not wear dresses.  Skirts or pants are appropriate. Please wear a short sleeve shirt. 6. No perfume, cologne or lotion. 7. Wear comfortable walking shoes. No  heels!        Your physician has recommended that you have an ablation please follow instruction sheet given. Catheter ablation is a medical procedure used to treat some cardiac arrhythmias (irregular heartbeats). During catheter ablation, a long, thin, flexible tube is put into a blood vessel in your groin (upper thigh), or neck. This tube is called an ablation catheter. It is then guided to your heart through the blood vessel. Radio frequency waves destroy small areas of heart tissue where abnormal heartbeats may cause an arrhythmia to start. Please see the instruction sheet given to you today.  Your physician has recommended you make the following change in your medication:  Stop your Metoprolol    Your physician recommends that you return for lab work on: 09/03/13 for labs  CBC  BMP INR

## 2013-08-21 NOTE — Progress Notes (Signed)
[name]      Patient Care Team: Morton Peters., MD as PCP - General (Unknown Physician Specialty)   HPI  [name] is a 68 y.o. male With a history of atrial flutter identified initially following back surgery in 2004.  Clinical recurrence of which was noted in 2012. [name] ) It was relatively typical.  I do not see discussion in the chart regarding anticoagulation on catheter ablation.  Echocardiogram 2007 demonstrated normal LV function and no significant valvular or structural abnormalities  Functional status is limited more by his back then by breathlessness.  He he has occasional chest pain which radiates to his back. It is unrelated to exertion.[name] He's had no intercurrent lateralizing neurological symptoms.  He is not taking anticoagulation. Thrombolic risk factors are notable for age, hypertension, diabetes. CHADS-VASc score is greater than or equal to 3  He denies dizziness or orthostatic symptoms  The chart is replete with a diagnosis of nonsustained ventricular tachycardia which was described as "no clinical significance"  Last ech 2007   Past Medical History  Diagnosis Date  . Palpitations   . Atrial flutter     well controlled with medical therapy  . Nonsustained ventricular tachycardia     no clinical significance at present  . S/P hip replacement     remote  . Asthma   . Prostate cancer   . Diabetes mellitus without complication   . Depression     History reviewed. No pertinent past surgical history.  Current Outpatient Prescriptions  Medication Sig Dispense Refill  . beclomethasone (QVAR) 80 MCG/ACT inhaler Inhale 2 puffs into the lungs 2 (two) times daily.      [name] CALCIUM CITRATE-VITAMIN D PO Take 2,000 Units by mouth daily.      . clorazepate (TRANXENE) 3.75 MG tablet Take 0.5 mg by mouth at bedtime.       . cyclobenzaprine (FLEXERIL) 10 MG tablet Take 10 mg by mouth 3 (three) times daily as needed for muscle spasms.      [name] diltiazem (DILACOR  XR) 240 MG 24 hr capsule Take 1 capsule (240 mg total) by mouth daily.  30 capsule  6  . metFORMIN (GLUCOPHAGE) 500 MG tablet Take 250 mg by mouth 2 (two) times daily with a meal.      . metoprolol tartrate (LOPRESSOR) 25 MG tablet Take 1 tablet (25 mg total) by mouth 2 (two) times daily.  180 tablet  3  . Multiple Vitamin (MULTIVITAMIN) tablet Take 1 tablet by mouth daily.      . Omega-3 Fatty Acids (OMEGA-3 FISH OIL PO) Take 700 mg by mouth daily.      . Thiamine HCl (VITAMIN B-1) 250 MG tablet Take 250 mg by mouth daily.      [name] MIXJECT 22.5 MG injection Inject 22.5 mg into the muscle every 6 (six) months.       . venlafaxine (EFFEXOR) 75 MG tablet Take 75 mg by mouth daily.      . vitamin C (ASCORBIC ACID) 500 MG tablet Take 500 mg by mouth daily.       No current facility-administered medications for this visit.    Allergies  Allergen Reactions  . Erythromycin     rash  . Morphine     Feels crazy & itching  . Penicillins     Rash   . Sulfonamide Derivatives     Rash & flushed    Review of Systems negative except from HPI and PMH  Physical Exam BP 120/66  Pulse 64  Ht 5' 9"  (1.753 m)  Wt 195 lb 8 oz (88.678 kg)  BMI 28.86 kg/m2 Well developed and well nourished in no acute distress HENT normal E scleral and icterus clear Neck Supple JVP flat; carotids brisk and full Clear to ausculation  Regular rate and rhythm, no murmurs gallops or rub Soft with active bowel sounds No clubbing cyanosis trace Edema Alert and oriented, grossly normal motor and sensory function although he walks with a stick Skin Warm and Dry  ECG SR 64  19/10/42  Assessment and  Plan  Atrial flutter Chest pain- atypical   the patient has had recurrent documented atrial flutter with a prior history of one-to-one conduction. I think it is reasonable to pursue catheter ablation to eliminate this substrate and this risk. He has not had atrial fibrillation and so we potentially would  reduce his thromboembolic risk as well.  It has been years since there is evaluation of LV function. We'll undertake a Myoview as this will help was also addressed he is coronary perfusion; he is also nonambulatory.  Given his rheumatoid arthritis, undertake this procedure with general anesthesia.  Given his fatigue, we will also the interim, stop his metoprolol and see if this abates. I wonder whether he has sleep apnea.

## 2013-08-23 ENCOUNTER — Telehealth: Payer: Self-pay | Admitting: *Deleted

## 2013-08-23 NOTE — Telephone Encounter (Signed)
Reviewed procedure date and times with patient  Patient verbalized understanding

## 2013-08-23 NOTE — Telephone Encounter (Signed)
Pt called again, still awaiting a call .

## 2013-08-23 NOTE — Telephone Encounter (Signed)
Patient called and has some questions on his procedure he is having.

## 2013-08-27 ENCOUNTER — Ambulatory Visit: Payer: Self-pay | Admitting: Internal Medicine

## 2013-08-27 DIAGNOSIS — R079 Chest pain, unspecified: Secondary | ICD-10-CM

## 2013-08-28 ENCOUNTER — Encounter: Payer: Self-pay | Admitting: Internal Medicine

## 2013-08-30 ENCOUNTER — Telehealth: Payer: Self-pay | Admitting: *Deleted

## 2013-08-30 NOTE — Telephone Encounter (Signed)
Please call patient. He is confuse about his procedure???

## 2013-08-30 NOTE — Telephone Encounter (Signed)
Reviewed ablation instructions with patient  Patient verbalized understanding

## 2013-09-03 ENCOUNTER — Other Ambulatory Visit: Payer: Medicare HMO

## 2013-09-04 ENCOUNTER — Ambulatory Visit (INDEPENDENT_AMBULATORY_CARE_PROVIDER_SITE_OTHER): Payer: Medicare HMO | Admitting: *Deleted

## 2013-09-04 ENCOUNTER — Encounter: Payer: Self-pay | Admitting: *Deleted

## 2013-09-04 DIAGNOSIS — I4892 Unspecified atrial flutter: Secondary | ICD-10-CM

## 2013-09-05 ENCOUNTER — Encounter (HOSPITAL_COMMUNITY): Payer: Self-pay | Admitting: Pharmacy Technician

## 2013-09-05 LAB — CBC WITH DIFFERENTIAL
BASOS ABS: 0.1 10*3/uL (ref 0.0–0.2)
Basos: 1 %
Eos: 8 %
Eosinophils Absolute: 0.8 10*3/uL — ABNORMAL HIGH (ref 0.0–0.4)
HCT: 42.3 % (ref 37.5–51.0)
Hemoglobin: 14 g/dL (ref 12.6–17.7)
IMMATURE GRANS (ABS): 0 10*3/uL (ref 0.0–0.1)
IMMATURE GRANULOCYTES: 0 %
LYMPHS: 26 %
Lymphocytes Absolute: 2.6 10*3/uL (ref 0.7–3.1)
MCH: 29 pg (ref 26.6–33.0)
MCHC: 33.1 g/dL (ref 31.5–35.7)
MCV: 88 fL (ref 79–97)
Monocytes Absolute: 1 10*3/uL — ABNORMAL HIGH (ref 0.1–0.9)
Monocytes: 10 %
Neutrophils Absolute: 5.3 10*3/uL (ref 1.4–7.0)
Neutrophils Relative %: 55 %
PLATELETS: 259 10*3/uL (ref 150–379)
RBC: 4.83 x10E6/uL (ref 4.14–5.80)
RDW: 13.8 % (ref 12.3–15.4)
WBC: 9.8 10*3/uL (ref 3.4–10.8)

## 2013-09-05 LAB — BASIC METABOLIC PANEL
BUN/Creatinine Ratio: 20 (ref 10–22)
BUN: 16 mg/dL (ref 8–27)
CHLORIDE: 101 mmol/L (ref 97–108)
CO2: 25 mmol/L (ref 18–29)
Calcium: 9.6 mg/dL (ref 8.6–10.2)
Creatinine, Ser: 0.79 mg/dL (ref 0.76–1.27)
GFR calc Af Amer: 107 mL/min/{1.73_m2} (ref >59)
GFR calc non Af Amer: 93 mL/min/{1.73_m2} (ref >59)
GLUCOSE: 140 mg/dL — AB (ref 65–99)
POTASSIUM: 4.7 mmol/L (ref 3.5–5.2)
Sodium: 141 mmol/L (ref 134–144)

## 2013-09-05 LAB — PROTIME-INR
INR: 1 (ref 0.8–1.2)
Prothrombin Time: 10.8 s (ref 9.1–12.0)

## 2013-09-07 ENCOUNTER — Encounter (HOSPITAL_COMMUNITY): Payer: Self-pay | Admitting: Anesthesiology

## 2013-09-07 ENCOUNTER — Ambulatory Visit (HOSPITAL_COMMUNITY)
Admission: RE | Admit: 2013-09-07 | Discharge: 2013-09-08 | Disposition: A | Discharge status: Home / Self Care | Payer: Medicare HMO | Source: Ambulatory Visit | Attending: Internal Medicine | Admitting: Internal Medicine

## 2013-09-07 ENCOUNTER — Ambulatory Visit (HOSPITAL_COMMUNITY): Payer: Medicare HMO | Admitting: Anesthesiology

## 2013-09-07 ENCOUNTER — Encounter (HOSPITAL_COMMUNITY)
Admission: RE | Disposition: A | Discharge status: Home / Self Care | Payer: Self-pay | Source: Ambulatory Visit | Attending: Internal Medicine

## 2013-09-07 ENCOUNTER — Encounter (HOSPITAL_COMMUNITY): Payer: Medicare HMO | Admitting: Anesthesiology

## 2013-09-07 DIAGNOSIS — Z9049 Acquired absence of other specified parts of digestive tract: Secondary | ICD-10-CM | POA: Insufficient documentation

## 2013-09-07 DIAGNOSIS — I472 Ventricular tachycardia, unspecified: Secondary | ICD-10-CM

## 2013-09-07 DIAGNOSIS — F329 Major depressive disorder, single episode, unspecified: Secondary | ICD-10-CM | POA: Diagnosis not present

## 2013-09-07 DIAGNOSIS — M069 Rheumatoid arthritis, unspecified: Secondary | ICD-10-CM | POA: Diagnosis not present

## 2013-09-07 DIAGNOSIS — Z8546 Personal history of malignant neoplasm of prostate: Secondary | ICD-10-CM | POA: Insufficient documentation

## 2013-09-07 DIAGNOSIS — IMO0002 Reserved for concepts with insufficient information to code with codable children: Secondary | ICD-10-CM | POA: Insufficient documentation

## 2013-09-07 DIAGNOSIS — I10 Essential (primary) hypertension: Secondary | ICD-10-CM | POA: Insufficient documentation

## 2013-09-07 DIAGNOSIS — Z96649 Presence of unspecified artificial hip joint: Secondary | ICD-10-CM | POA: Insufficient documentation

## 2013-09-07 DIAGNOSIS — E119 Type 2 diabetes mellitus without complications: Secondary | ICD-10-CM | POA: Diagnosis not present

## 2013-09-07 DIAGNOSIS — F3289 Other specified depressive episodes: Secondary | ICD-10-CM | POA: Diagnosis not present

## 2013-09-07 DIAGNOSIS — J4489 Other specified chronic obstructive pulmonary disease: Secondary | ICD-10-CM | POA: Insufficient documentation

## 2013-09-07 DIAGNOSIS — J449 Chronic obstructive pulmonary disease, unspecified: Secondary | ICD-10-CM | POA: Insufficient documentation

## 2013-09-07 DIAGNOSIS — I4892 Unspecified atrial flutter: Secondary | ICD-10-CM | POA: Diagnosis present

## 2013-09-07 DIAGNOSIS — I483 Typical atrial flutter: Secondary | ICD-10-CM

## 2013-09-07 HISTORY — DX: Ulcerative colitis, unspecified, without complications: K51.90

## 2013-09-07 HISTORY — DX: Essential (primary) hypertension: I10

## 2013-09-07 HISTORY — PX: ABLATION: SHX5711

## 2013-09-07 HISTORY — PX: ATRIAL FLUTTER ABLATION: SHX5733

## 2013-09-07 LAB — GLUCOSE, CAPILLARY
GLUCOSE-CAPILLARY: 148 mg/dL — AB (ref 70–99)
GLUCOSE-CAPILLARY: 214 mg/dL — AB (ref 70–99)
GLUCOSE-CAPILLARY: 92 mg/dL (ref 70–99)
Glucose-Capillary: 128 mg/dL — ABNORMAL HIGH (ref 70–99)
Glucose-Capillary: 136 mg/dL — ABNORMAL HIGH (ref 70–99)

## 2013-09-07 SURGERY — ATRIAL FLUTTER ABLATION
Anesthesia: Monitor Anesthesia Care

## 2013-09-07 MED ORDER — PHENYLEPHRINE HCL 10 MG/ML IJ SOLN
INTRAMUSCULAR | Status: DC | PRN
  Administered 2013-09-07 (×2): 40 ug via INTRAVENOUS

## 2013-09-07 MED ORDER — BECLOMETHASONE DIPROPIONATE 80 MCG/ACT IN AERS
2.0000 | INHALATION_SPRAY | Freq: Two times a day (BID) | RESPIRATORY_TRACT | Status: DC
  Administered 2013-09-07 – 2013-09-08 (×3): 2 via RESPIRATORY_TRACT
  Filled 2013-09-07: qty 8.7

## 2013-09-07 MED ORDER — FENTANYL CITRATE 0.05 MG/ML IJ SOLN
INTRAMUSCULAR | Status: DC | PRN
  Administered 2013-09-07 (×2): 50 ug via INTRAVENOUS

## 2013-09-07 MED ORDER — TRIPTORELIN PAMOATE 22.5 MG IM SUSR: 22.5000 mg | INTRAMUSCULAR | Status: DC

## 2013-09-07 MED ORDER — ONDANSETRON HCL 4 MG/2ML IJ SOLN
INTRAMUSCULAR | Status: DC | PRN
  Administered 2013-09-07: 4 mg via INTRAVENOUS

## 2013-09-07 MED ORDER — SODIUM CHLORIDE 0.9 % IJ SOLN
3.0000 mL | Freq: Two times a day (BID) | INTRAMUSCULAR | Status: DC
  Administered 2013-09-07: 21:00:00 3 mL via INTRAVENOUS

## 2013-09-07 MED ORDER — METFORMIN HCL 500 MG PO TABS
250.0000 mg | ORAL_TABLET | Freq: Two times a day (BID) | ORAL | Status: DC
  Administered 2013-09-08: 250 mg via ORAL
  Filled 2013-09-07 (×4): qty 1

## 2013-09-07 MED ORDER — HEPARIN (PORCINE) IN NACL 2-0.9 UNIT/ML-% IJ SOLN
INTRAMUSCULAR | Status: AC
  Filled 2013-09-07: qty 500

## 2013-09-07 MED ORDER — METOPROLOL TARTRATE 25 MG PO TABS
25.0000 mg | ORAL_TABLET | Freq: Two times a day (BID) | ORAL | Status: DC
  Administered 2013-09-07: 25 mg via ORAL
  Filled 2013-09-07 (×2): qty 1

## 2013-09-07 MED ORDER — HYDROMORPHONE HCL PF 1 MG/ML IJ SOLN: 0.2500 mg | INTRAMUSCULAR | Status: DC | PRN

## 2013-09-07 MED ORDER — CHLORHEXIDINE GLUCONATE CLOTH 2 % EX PADS: 6.0000 | MEDICATED_PAD | Freq: Once | CUTANEOUS | Status: DC

## 2013-09-07 MED ORDER — SODIUM CHLORIDE 0.9 % IJ SOLN
3.0000 mL | INTRAMUSCULAR | Status: DC | PRN
Start: 2013-09-07 — End: 2013-09-08

## 2013-09-07 MED ORDER — VITAMIN B-1 50 MG PO TABS
250.0000 mg | ORAL_TABLET | Freq: Every day | ORAL | Status: DC
  Administered 2013-09-07 – 2013-09-08 (×2): 250 mg via ORAL
  Filled 2013-09-07 (×2): qty 1

## 2013-09-07 MED ORDER — SODIUM CHLORIDE 0.9 % IV SOLN
INTRAVENOUS | Status: AC
  Administered 2013-09-07: 10:00:00 via INTRAVENOUS

## 2013-09-07 MED ORDER — ONDANSETRON HCL 4 MG/2ML IJ SOLN
4.0000 mg | Freq: Four times a day (QID) | INTRAMUSCULAR | Status: DC | PRN
Start: 2013-09-07 — End: 2013-09-08

## 2013-09-07 MED ORDER — SODIUM CHLORIDE 0.9 % IV SOLN: 250.0000 mL | INTRAVENOUS | Status: DC | PRN

## 2013-09-07 MED ORDER — PROPOFOL 10 MG/ML IV BOLUS
INTRAVENOUS | Status: DC | PRN
  Administered 2013-09-07: 200 mg via INTRAVENOUS

## 2013-09-07 MED ORDER — SIMETHICONE 80 MG PO CHEW
80.0000 mg | CHEWABLE_TABLET | Freq: Four times a day (QID) | ORAL | Status: DC | PRN
  Administered 2013-09-07: 160 mg via ORAL
  Filled 2013-09-07 (×2): qty 2

## 2013-09-07 MED ORDER — VENLAFAXINE HCL 75 MG PO TABS
75.0000 mg | ORAL_TABLET | Freq: Every day | ORAL | Status: DC
  Administered 2013-09-07 – 2013-09-08 (×2): 75 mg via ORAL
  Filled 2013-09-07 (×2): qty 1

## 2013-09-07 MED ORDER — HEPARIN SODIUM (PORCINE) 1000 UNIT/ML IJ SOLN
INTRAMUSCULAR | Status: AC
  Filled 2013-09-07: qty 1

## 2013-09-07 MED ORDER — OXYCODONE-ACETAMINOPHEN 5-325 MG PO TABS
1.0000 | ORAL_TABLET | Freq: Four times a day (QID) | ORAL | Status: DC | PRN
  Administered 2013-09-07: 1 via ORAL
  Administered 2013-09-07: 12:00:00 2 via ORAL
  Administered 2013-09-08: 1 via ORAL
  Filled 2013-09-07 (×2): qty 1
  Filled 2013-09-07: qty 2

## 2013-09-07 MED ORDER — LIDOCAINE HCL (CARDIAC) 20 MG/ML IV SOLN
INTRAVENOUS | Status: DC | PRN
  Administered 2013-09-07: 60 mg via INTRAVENOUS

## 2013-09-07 MED ORDER — VITAMIN C 500 MG PO TABS
500.0000 mg | ORAL_TABLET | Freq: Every day | ORAL | Status: DC
  Administered 2013-09-08: 09:00:00 500 mg via ORAL
  Filled 2013-09-07 (×2): qty 1

## 2013-09-07 MED ORDER — BUPIVACAINE HCL (PF) 0.25 % IJ SOLN
INTRAMUSCULAR | Status: AC
Start: 2013-09-07 — End: 2013-09-07
  Filled 2013-09-07: qty 60

## 2013-09-07 MED ORDER — ACETAMINOPHEN 325 MG PO TABS: 650.0000 mg | ORAL_TABLET | ORAL | Status: DC | PRN

## 2013-09-07 MED ORDER — ARTIFICIAL TEARS OP OINT
TOPICAL_OINTMENT | OPHTHALMIC | Status: DC | PRN
  Administered 2013-09-07: 1 via OPHTHALMIC

## 2013-09-07 MED ORDER — DILTIAZEM HCL ER 240 MG PO CP24
240.0000 mg | ORAL_CAPSULE | Freq: Every day | ORAL | Status: DC
  Administered 2013-09-08: 240 mg via ORAL
  Filled 2013-09-07: qty 1

## 2013-09-07 MED ORDER — SODIUM CHLORIDE 0.9 % IV SOLN
INTRAVENOUS | Status: DC
  Administered 2013-09-07: 09:00:00 via INTRAVENOUS
  Administered 2013-09-07: 1000 mL via INTRAVENOUS

## 2013-09-07 MED ORDER — ALBUTEROL SULFATE HFA 108 (90 BASE) MCG/ACT IN AERS
INHALATION_SPRAY | RESPIRATORY_TRACT | Status: DC | PRN
  Administered 2013-09-07: 2 via RESPIRATORY_TRACT

## 2013-09-07 MED ORDER — ACETAMINOPHEN 500 MG PO TABS: 1000.0000 mg | ORAL_TABLET | Freq: Four times a day (QID) | ORAL | Status: DC | PRN

## 2013-09-07 MED ORDER — CYCLOBENZAPRINE HCL 10 MG PO TABS: 5.0000 mg | ORAL_TABLET | Freq: Every evening | ORAL | Status: DC | PRN

## 2013-09-07 MED ORDER — OFF THE BEAT BOOK
Freq: Once | Status: AC
  Administered 2013-09-08: 02:00:00
  Filled 2013-09-07: qty 1

## 2013-09-07 MED ORDER — ONDANSETRON HCL 4 MG/2ML IJ SOLN: 4.0000 mg | Freq: Once | INTRAMUSCULAR | Status: AC | PRN

## 2013-09-07 NOTE — CV Procedure (Signed)
[name] [id number]  [id number]  Preop [facility]:CNPSZJ flutter Postop Dx same/   Procedure:EPS and LA mapping and RF catheter ablation  Cx:  Arterial puncture x 4 on the Right pressure held aobut 8 minutes  EBL: Minimal    Dictation number [id number]  Rodney Axe, MD 09/07/2013 9:04 AM

## 2013-09-07 NOTE — H&P (View-Only) (Signed)
[name]      Patient Care Team: Morton Peters., MD as PCP - General (Unknown Physician Specialty)   HPI  [name] is a 68 y.o. male With a history of atrial flutter identified initially following back surgery in 2004.  Clinical recurrence of which was noted in 2012. [name] ) It was relatively typical.  I do not see discussion in the chart regarding anticoagulation on catheter ablation.  Echocardiogram 2007 demonstrated normal LV function and no significant valvular or structural abnormalities  Functional status is limited more by his back then by breathlessness.  He he has occasional chest pain which radiates to his back. It is unrelated to exertion.[name] He's had no intercurrent lateralizing neurological symptoms.  He is not taking anticoagulation. Thrombolic risk factors are notable for age, hypertension, diabetes. CHADS-VASc score is greater than or equal to 3  He denies dizziness or orthostatic symptoms  The chart is replete with a diagnosis of nonsustained ventricular tachycardia which was described as "no clinical significance"  Last ech 2007   Past Medical History  Diagnosis Date  . Palpitations   . Atrial flutter     well controlled with medical therapy  . Nonsustained ventricular tachycardia     no clinical significance at present  . S/P hip replacement     remote  . Asthma   . Prostate cancer   . Diabetes mellitus without complication   . Depression     History reviewed. No pertinent past surgical history.  Current Outpatient Prescriptions  Medication Sig Dispense Refill  . beclomethasone (QVAR) 80 MCG/ACT inhaler Inhale 2 puffs into the lungs 2 (two) times daily.      [name] CALCIUM CITRATE-VITAMIN D PO Take 2,000 Units by mouth daily.      . clorazepate (TRANXENE) 3.75 MG tablet Take 0.5 mg by mouth at bedtime.       . cyclobenzaprine (FLEXERIL) 10 MG tablet Take 10 mg by mouth 3 (three) times daily as needed for muscle spasms.      [name] diltiazem (DILACOR  XR) 240 MG 24 hr capsule Take 1 capsule (240 mg total) by mouth daily.  30 capsule  6  . metFORMIN (GLUCOPHAGE) 500 MG tablet Take 250 mg by mouth 2 (two) times daily with a meal.      . metoprolol tartrate (LOPRESSOR) 25 MG tablet Take 1 tablet (25 mg total) by mouth 2 (two) times daily.  180 tablet  3  . Multiple Vitamin (MULTIVITAMIN) tablet Take 1 tablet by mouth daily.      . Omega-3 Fatty Acids (OMEGA-3 FISH OIL PO) Take 700 mg by mouth daily.      . Thiamine HCl (VITAMIN B-1) 250 MG tablet Take 250 mg by mouth daily.      [name] MIXJECT 22.5 MG injection Inject 22.5 mg into the muscle every 6 (six) months.       . venlafaxine (EFFEXOR) 75 MG tablet Take 75 mg by mouth daily.      . vitamin C (ASCORBIC ACID) 500 MG tablet Take 500 mg by mouth daily.       No current facility-administered medications for this visit.    Allergies  Allergen Reactions  . Erythromycin     rash  . Morphine     Feels crazy & itching  . Penicillins     Rash   . Sulfonamide Derivatives     Rash & flushed    Review of Systems negative except from HPI and PMH  Physical Exam BP 120/66  Pulse 64  Ht 5' 9"  (1.753 m)  Wt 195 lb 8 oz (88.678 kg)  BMI 28.86 kg/m2 Well developed and well nourished in no acute distress HENT normal E scleral and icterus clear Neck Supple JVP flat; carotids brisk and full Clear to ausculation  Regular rate and rhythm, no murmurs gallops or rub Soft with active bowel sounds No clubbing cyanosis trace Edema Alert and oriented, grossly normal motor and sensory function although he walks with a stick Skin Warm and Dry  ECG SR 64  19/10/42  Assessment and  Plan  Atrial flutter Chest pain- atypical   the patient has had recurrent documented atrial flutter with a prior history of one-to-one conduction. I think it is reasonable to pursue catheter ablation to eliminate this substrate and this risk. He has not had atrial fibrillation and so we potentially would  reduce his thromboembolic risk as well.  It has been years since there is evaluation of LV function. We'll undertake a Myoview as this will help was also addressed he is coronary perfusion; he is also nonambulatory.  Given his rheumatoid arthritis, undertake this procedure with general anesthesia.  Given his fatigue, we will also the interim, stop his metoprolol and see if this abates. I wonder whether he has sleep apnea.

## 2013-09-07 NOTE — Transfer of Care (Signed)
Immediate Anesthesia Transfer of Care Note  Patient: [name]  Procedure(s) Performed: Procedure(s): ATRIAL FLUTTER ABLATION (N/A)  Patient Location: Cath Lab  Anesthesia Type:General  Level of Consciousness: awake, oriented and patient cooperative  Airway & Oxygen Therapy: Patient Spontanous Breathing and Patient connected to face mask oxygen  Post-op Assessment: Report given to PACU RN and Post -op Vital signs reviewed and stable  Post vital signs: Reviewed  Complications: No apparent anesthesia complications

## 2013-09-07 NOTE — Interval H&P Note (Signed)
History and Physical Interval Note:  myoview with normal perfusion and normal LV funciton  09/07/2013 7:01 AM  [name]  has presented today for surgery, with the diagnosis of aflutter  The various methods of treatment have been discussed with the patient and family. After consideration of risks, benefits and other options for treatment, the patient has consented to  Procedure(s): ATRIAL FLUTTER ABLATION (N/A) as a surgical intervention .  The patient's history has been reviewed, patient examined, no change in status, stable for surgery.  I have reviewed the patient's chart and labs.  Questions were answered to the patient's satisfaction.     Virl Axe

## 2013-09-07 NOTE — Anesthesia Procedure Notes (Signed)
Procedure Name: LMA Insertion Date/Time: 09/07/2013 7:41 AM Performed by: Jenne Campus Pre-anesthesia Checklist: Patient identified, Emergency Drugs available, Suction available, Patient being monitored and Timeout performed Patient Re-evaluated:Patient Re-evaluated prior to inductionOxygen Delivery Method: Circle system utilized Preoxygenation: Pre-oxygenation with 100% oxygen Intubation Type: IV induction Ventilation: Mask ventilation without difficulty LMA: LMA inserted LMA Size: 5.0 Number of attempts: 1 Placement Confirmation: CO2 detector,  positive ETCO2 and breath sounds checked- equal and bilateral Tube secured with: Tape Dental Injury: Teeth and Oropharynx as per pre-operative assessment

## 2013-09-07 NOTE — Anesthesia Preprocedure Evaluation (Addendum)
Anesthesia Evaluation  Patient identified by MRN, date of birth, ID band Patient awake    Reviewed: Allergy & Precautions, H&P , NPO status , Patient's Chart, lab work & pertinent test results, reviewed documented beta blocker date and time   History of Anesthesia Complications Negative for: history of anesthetic complications  Airway Mallampati: II TM Distance: >3 FB Neck ROM: Full    Dental  (+) Teeth Intact, Dental Advisory Given   Pulmonary asthma , COPD COPD inhaler, former smoker,    + wheezing      Cardiovascular hypertension, Pt. on medications and Pt. on home beta blockers + dysrhythmias Atrial Fibrillation Rhythm:Regular Rate:Normal     Neuro/Psych negative neurological ROS     GI/Hepatic negative GI ROS, Neg liver ROS,   Endo/Other  diabetes, Type 2, Oral Hypoglycemic Agents  Renal/GU negative Renal ROS     Musculoskeletal   Abdominal   Peds  Hematology   Anesthesia Other Findings   Reproductive/Obstetrics                          Anesthesia Physical Anesthesia Plan  ASA: III  Anesthesia Plan: MAC and General   Post-op Pain Management:    Induction: Intravenous  Airway Management Planned: LMA and Mask  Additional Equipment:   Intra-op Plan:   Post-operative Plan:   Informed Consent: I have reviewed the patients History and Physical, chart, labs and discussed the procedure including the risks, benefits and alternatives for the proposed anesthesia with the patient or authorized representative who has indicated his/her understanding and acceptance.     Plan Discussed with: CRNA, Anesthesiologist and Surgeon  Anesthesia Plan Comments:         Anesthesia Quick Evaluation

## 2013-09-07 NOTE — Anesthesia Postprocedure Evaluation (Signed)
  Anesthesia Post-op Note  Patient: [name]  Procedure(s) Performed: Procedure(s): ATRIAL FLUTTER ABLATION (N/A)  Patient Location: PACU  Anesthesia Type:General  Level of Consciousness: awake, oriented, sedated and patient cooperative  Airway and Oxygen Therapy: Patient Spontanous Breathing  Post-op Pain: none  Post-op Assessment: Post-op Vital signs reviewed, Patient's Cardiovascular Status Stable, Respiratory Function Stable, Patent Airway, No signs of Nausea or vomiting and Pain level controlled  Post-op Vital Signs: stable  Last Vitals:  Filed Vitals:   09/07/13 1005  BP: 141/56  Pulse: 74  Temp:   Resp: 16    Complications: No apparent anesthesia complications

## 2013-09-07 NOTE — Op Note (Signed)
[name], [name]               ACCOUNT NO.:  [id number]  MEDICAL RECORD NO.:  [id number]  LOCATION:  [facility]                         FACILITY:  [facility]  PHYSICIAN:  Deboraha Sprang, MD, FACCDATE OF BIRTH:  [birth year]  DATE OF PROCEDURE:  09/07/2013 DATE OF DISCHARGE:                              OPERATIVE REPORT   PREOPERATIVE DIAGNOSIS:  Atrial flutter.  POSTOPERATIVE DIAGNOSIS:  Atrial flutter.  PROCEDURE:  Electrophysiological study with left atrial mapping and RF catheter ablation.  Following obtaining informed consent, the patient was brought to the electrophysiology laboratory and submitted for general anesthesia under the care of Dr. Tamala Julian.  After routine prep and drape, cardiac catheterization was performed.  Following the procedure, the catheters were removed.  Hemostasis was obtained.  The patient was transferred to the holding area in stable condition.  CATHETERS:  A 5-French quadripolar catheter was inserted via left femoral vein to the AV junction.  A 7-French dual decapolar catheter was inserted via the left femoral vein to the tricuspid anulus from the coronary sinus.  An 8-French 10-mm deflectable tip ablation catheter was inserted via the right femoral vein  using SAFL sheath to the posterior septal space.  SURFACE LEADS:  I, aVF and V1 were monitored continuously throughout the procedure.  Following insertion of the catheters, a stimulation protocol included incremental atrial pacing, incremental ventricular pacing, and single atrial extrastimuli at paced cycle length of 600 milliseconds.  END-TIDAL RESULTS:  End-tidal baseline measurements and intracardiac intervals:  Rhythm:  Rhythm-initial:  Sinus; RR interval 784 milliseconds; PR interval 185 milliseconds; P-wave duration 119 millisecond; QRS duration:  91 milliseconds; QT interval:  403 milliseconds.  AH interval 101 millisecond; HV interval 42 milliseconds.  Rhythm-final:  Sinus; RR interval  858 milliseconds; PR interval 168 milliseconds; P-wave duration 160 millisecond; QRS duration 107 milliseconds; QT interval 461 milliseconds.  AH interval 91 millisecond; HV interval 47 milliseconds.  End-tidal AV nodal function for sinus AV Wenckebach was 450 milliseconds.  VA Wenckebach was 500 milliseconds.  AV nodal effective refractory period of 600 milliseconds was 360 milliseconds without evidence of dual AV nodal physiology.  Accessory pathway function:  No accessory pathway was identified.  Ventricular response programmed stimulation was normal for ventricular stimulation as described.  The patient had documented atrial flutter.  We thus undertook catheter ablation using interval mapping.  A total of 5 minutes and 50 seconds of RF was delivered across the cavotricuspid isthmus resulting in interruption of isthmus conduction, the A1-A2 interval 140 milliseconds.  Fluoroscopy time:  A total of 6.1 minutes of fluoroscopy time was utilized.  IMPRESSION: 1. Normal sinus function. 2. Abnormal atrial function manifested by sustained atrial flutter. 3. Normal AV nodal function. 4. Normal His-Purkinje system function. 5. No accessory pathway. 6. Normal ventricular response to programmed stimulation.  Summary of conclusion, the results of electrophysiological testing confirmed a cavotricuspid isthmus conduction underline the substrate for the patient's typical atrial flutter.  Catheter ablation across the cavotricuspid isthmus resulted in bidirectional isthmus conduction block.  The patient tolerated the procedure without apparent complication.     Deboraha Sprang, MD, [facility]     SCK/MEDQ  D:  09/07/2013  T:  09/07/2013  Job:  [id number]

## 2013-09-08 ENCOUNTER — Encounter (HOSPITAL_COMMUNITY): Payer: Self-pay | Admitting: *Deleted

## 2013-09-08 DIAGNOSIS — I472 Ventricular tachycardia, unspecified: Secondary | ICD-10-CM

## 2013-09-08 DIAGNOSIS — E119 Type 2 diabetes mellitus without complications: Secondary | ICD-10-CM | POA: Diagnosis not present

## 2013-09-08 DIAGNOSIS — I10 Essential (primary) hypertension: Secondary | ICD-10-CM | POA: Diagnosis not present

## 2013-09-08 DIAGNOSIS — I4729 Other ventricular tachycardia: Secondary | ICD-10-CM

## 2013-09-08 DIAGNOSIS — J449 Chronic obstructive pulmonary disease, unspecified: Secondary | ICD-10-CM | POA: Diagnosis not present

## 2013-09-08 DIAGNOSIS — I4892 Unspecified atrial flutter: Secondary | ICD-10-CM | POA: Diagnosis not present

## 2013-09-08 LAB — GLUCOSE, CAPILLARY: Glucose-Capillary: 138 mg/dL — ABNORMAL HIGH (ref 70–99)

## 2013-09-08 NOTE — Discharge Summary (Addendum)
ELECTROPHYSIOLOGY PROCEDURE DISCHARGE SUMMARY    Patient ID: [name],  MRN: [id number], DOB/AGE: 03/23/1945 68 y.o.  Admit date: 09/07/2013 Discharge date: 09/08/2013  Primary Care Physician: Ronald Lobo, MD Primary Cardiologist: Rockey Situ Electrophysiologist: Caryl Comes  Primary Discharge Diagnosis:  Atrial flutter status post ablation this admission  Secondary Discharge Diagnosis:  1.  Degenerative disk disease 2.  Ulcerative colitis s/p colectomy and colostomy 3.  Rheumatoid arthritis 4.  Depression   Allergies  Allergen Reactions  . Erythromycin Rash  . Morphine Other (See Comments)    Feels crazy & itching  . Penicillins Rash       . Sulfonamide Derivatives Other (See Comments)    Rash & flushed     Procedures This Admission:  1.  Electrophysiology study and radiofrequency catheter ablation of atrial flutter on 09-07-13 by Dr Caryl Comes.  This study demonstrated normal sinus function, isthmus dependent atrial flutter that was successfully ablated with complete bidirectional isthmus block achieved. There were no early apparent complications.   Brief HPI: [name] is a 68 y.o. male with a past medical history of atrial flutter first diagnosed in 2004. He has been controlled for many years with medical therapy.  He has had recurrent atrial flutter with documented 1:1 conduction. Risks, benefits, and alternatives to ablation were reviewed with the patient who wished to proceed.   Hospital Course:  The patient was admitted and underwent ablation of atrial flutter with details as outlined above.  He was monitored on telemetry overnight which demonstrated SR.  His groins were without complication.  He was evaluated by Dr Caryl Comes and considered stable for discharge to home.   Discharge Vitals: Blood pressure 129/74, pulse 85, temperature 97.9 F (36.6 C), temperature source Oral, resp. rate 18, height 5' 9.5" (1.765 m), weight 199 lb 15.3 oz (90.7 kg), SpO2 96.00%.   Well developed and nourished in no acute distress HENT normal Neck supple with JVP-flat Clear Regular rate and rhythm, no murmurs or gallops Abd-soft with active BS No Clubbing cyanosis edema Skin-warm and dry A & Oriented  Grossly normal sensory and motor function   Labs:   Lab Results  Component Value Date   WBC 9.8 09/04/2013   HGB 14.0 09/04/2013   HCT 42.3 09/04/2013   MCV 88 09/04/2013   PLT 259 09/04/2013     Recent Labs Lab 09/04/13 0917  NA 141  K 4.7  CL 101  CO2 25  BUN 16  CREATININE 0.79  CALCIUM 9.6  GLUCOSE 140*    Discharge Medications:    Medication List    ASK your doctor about these medications       acetaminophen 500 MG tablet  Commonly known as:  TYLENOL  Take 1,000 mg by mouth every 6 (six) hours as needed for moderate pain or headache.     ANTI-DIARRHEAL PO  Take 1-3 tablets by mouth daily as needed (for diahrrea).     cetirizine 10 MG tablet  Commonly known as:  ZYRTEC  Take 10 mg by mouth daily.     cyclobenzaprine 10 MG tablet  Commonly known as:  FLEXERIL  Take 5 mg by mouth at bedtime as needed for muscle spasms.     diltiazem 240 MG 24 hr capsule  Commonly known as:  DILACOR XR  Take 1 capsule (240 mg total) by mouth daily.     GAS RELIEF 80 MG chewable tablet  Generic drug:  simethicone  Chew 80-160 mg by mouth every 6 (  six) hours as needed for flatulence.     metFORMIN 500 MG tablet  Commonly known as:  GLUCOPHAGE  Take 250 mg by mouth 2 (two) times daily with a meal.        MULTIVITAMIN & MINERAL PO  Take 1 tablet by mouth daily.     OMEGA-3 FISH OIL PO  Take 1,600 mg by mouth daily.     OVER THE COUNTER MEDICATION  Take 2 tablets by mouth 2 (two) times daily. "[place]"     QVAR 80 MCG/ACT inhaler  Generic drug:  beclomethasone  Inhale 2 puffs into the lungs 2 (two) times daily.     TRELSTAR MIXJECT 22.5 MG injection  Generic drug:  Triptorelin Pamoate  Inject 22.5 mg into the muscle every 6 (six) months.      venlafaxine 75 MG tablet  Commonly known as:  EFFEXOR  Take 75 mg by mouth daily.     vitamin B-1 250 MG tablet  Take 250 mg by mouth daily.     vitamin C 500 MG tablet  Commonly known as:  ASCORBIC ACID  Take 500 mg by mouth daily.        Disposition: discharge to home followup [facility]   Duration of Discharge Encounter: Greater than 30 minutes including physician time.  Signed,

## 2013-10-16 ENCOUNTER — Encounter: Payer: Medicare HMO | Admitting: Internal Medicine

## 2013-11-06 ENCOUNTER — Other Ambulatory Visit: Payer: Self-pay | Admitting: Internal Medicine

## 2013-11-06 ENCOUNTER — Encounter: Payer: Self-pay | Admitting: Internal Medicine

## 2013-11-06 ENCOUNTER — Ambulatory Visit (INDEPENDENT_AMBULATORY_CARE_PROVIDER_SITE_OTHER): Payer: Medicare HMO | Admitting: Internal Medicine

## 2013-11-06 ENCOUNTER — Telehealth: Payer: Self-pay | Admitting: Internal Medicine

## 2013-11-06 VITALS — BP 98/64 | HR 104 | Ht 69.5 in | Wt 198.5 lb

## 2013-11-06 DIAGNOSIS — R0602 Shortness of breath: Secondary | ICD-10-CM

## 2013-11-06 DIAGNOSIS — Z9889 Other specified postprocedural states: Secondary | ICD-10-CM

## 2013-11-06 DIAGNOSIS — R42 Dizziness and giddiness: Secondary | ICD-10-CM

## 2013-11-06 DIAGNOSIS — R002 Palpitations: Secondary | ICD-10-CM

## 2013-11-06 DIAGNOSIS — R000 Tachycardia, unspecified: Secondary | ICD-10-CM

## 2013-11-06 DIAGNOSIS — I471 Supraventricular tachycardia: Secondary | ICD-10-CM

## 2013-11-06 DIAGNOSIS — Z8679 Personal history of other diseases of the circulatory system: Secondary | ICD-10-CM

## 2013-11-06 MED ORDER — DILTIAZEM HCL ER COATED BEADS 120 MG PO CP24: 120.0000 mg | ORAL_CAPSULE | Freq: Every day | ORAL | Status: DC

## 2013-11-06 NOTE — Telephone Encounter (Signed)
Please see note below. Please advise.

## 2013-11-06 NOTE — Telephone Encounter (Signed)
calling to let us know that Diltiazem was just sent in by dr.taylor 264m and then dr.klein 1224m Just wanted to make sure which one to fill, please call back to them.

## 2013-11-06 NOTE — Patient Instructions (Signed)
Your physician has recommended you make the following change in your medication:  Decrease Diltiazem to 120 mg once daily   Your physician recommends that you have labs today: D Dimer  TSH  CBC   Please take orders to [facility] registration desk for labs   You have been referred to Dr. Hardin Negus for a sleep study  I will call you with appt date and time   Your physician wants you to follow-up in: 6 months with Dr. Caryl Comes. You will receive a reminder letter in the mail two months in advance. If you don't receive a letter, please call our office to schedule the follow-up appointment.  Your next appointment will be scheduled in our new office located at :  [facility]  [address], [place]  [place], [place] [address]

## 2013-11-06 NOTE — Telephone Encounter (Signed)
Informed pharmacist that the dose should be Diltiazem 120 mg once daily  She verbalized understanding

## 2013-11-06 NOTE — Progress Notes (Signed)
[name]      Patient Care Team: [name]., MD as PCP - General (Unknown Physician Specialty)   HPI  [name] is a 68 y.o. male With a history of atrial flutter identified initially following back surgery in 2004.  Clinical recurrence of which was noted in 2012. [name] ) It was relatively typical.  I do not see discussion in the chart regarding anticoagulation on catheter ablation.  Echocardiogram 2007 demonstrated normal LV function and no significant valvular or structural abnormalities ; he underwent Myoview scanning 8/15 demonstrated normal LV function and no ischemia.    He underwent catheter ablation for atrial flutter 8/15.  He continues to struggle with functional capacity related to his back. He has some lightheadedness with standing.     Past Medical History  Diagnosis Date  . Atrial flutter     s/p ablation 08-2013  . Nonsustained ventricular tachycardia     no clinical significance at present  . S/P hip replacement     remote  . Asthma   . Prostate cancer   . Depression   . Hypertension   . 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 Ulcerative colitis     HX OF     Past Surgical History  Procedure Laterality Date  . Ileostomy    . Back surgery    . Hip surgery Right   . Tonsillectomy    . Ablation  09/07/2013    CTI ablation by Dr [name]    Current Outpatient Prescriptions  Medication Sig Dispense Refill  . acetaminophen (TYLENOL) 500 MG tablet Take 1,000 mg by mouth every 6 (six) hours as needed for moderate pain or headache.      . beclomethasone (QVAR) 80 MCG/ACT inhaler Inhale 2 puffs into the lungs as needed.       . cetirizine (ZYRTEC) 10 MG tablet Take 10 mg by mouth daily.      . cyclobenzaprine (FLEXERIL) 10 MG tablet Take 5 mg by mouth at bedtime as needed for muscle spasms.       [name] diltiazem (DILACOR XR) 240 MG 24 hr capsule Take 1 capsule (240 mg total) by mouth daily.  30 capsule  6  . Loperamide HCl  (ANTI-DIARRHEAL PO) Take 1-3 tablets by mouth daily as needed (for diahrrea).      . metFORMIN (GLUCOPHAGE) 500 MG tablet Take 250 mg by mouth 2 (two) times daily with a meal.      . simethicone (GAS RELIEF) 80 MG chewable tablet Chew 80-160 mg by mouth every 6 (six) hours as needed for flatulence.      . Thiamine HCl (VITAMIN B-1) 250 MG tablet Take 250 mg by mouth daily.      [name] MIXJECT 22.5 MG injection Inject 22.5 mg into the muscle every 6 (six) months.       . venlafaxine (EFFEXOR) 75 MG tablet Take 75 mg by mouth daily.      . vitamin C (ASCORBIC ACID) 500 MG tablet Take 500 mg by mouth daily.       No current facility-administered medications for this visit.    Allergies  Allergen Reactions  . Erythromycin Rash  . Morphine Other (See Comments)    Feels crazy & itching  . Penicillins Rash       . Sulfonamide Derivatives Other (See Comments)    Rash & flushed    Review of Systems negative except from HPI and PMH  Physical Exam BP 98/64  Pulse 104  Ht 5' 9.5" (1.765 m)  Wt 198 lb 8 oz (90.039 kg)  BMI 28.90 kg/m2 Well developed and well nourished in no acute distress HENT normal E scleral and icterus clear Neck Supple JVP flat; carotids brisk and full Clear to ausculation  Regular rate and rhythm, no murmurs gallops or rub Soft with active bowel sounds No clubbing cyanosis trace Edema Alert and oriented, grossly normal motor and sensory function although he walks with a stick Skin Warm and Dry  ECG SR 104  19/10/42  Assessment and  Plan  Atrial flutter Sinus tachycardia Lightheadedness-orthostatic Hypotension Daytime somnolence Dyspnea on exertion   Most impressive on presentation today is his sinus tachycardia. It relieves with recumbency and there is modest evidence of orthostatic intolerance. We will discontinue his diltiazem. Will check a CBC a TSH and a d-dimer is no longer on anticoagulation and he is relatively inactive.   He needs an  outpatient sleep study and will defer this to his PCP

## 2013-11-07 LAB — CBC WITH DIFFERENTIAL
BASOS ABS: 0.1 10*3/uL (ref 0.0–0.2)
BASOS: 1 %
Eos: 6 %
Eosinophils Absolute: 0.6 10*3/uL — ABNORMAL HIGH (ref 0.0–0.4)
HCT: 39.2 % (ref 37.5–51.0)
HEMOGLOBIN: 13 g/dL (ref 12.6–17.7)
IMMATURE GRANS (ABS): 0 10*3/uL (ref 0.0–0.1)
Immature Granulocytes: 0 %
LYMPHS: 20 %
Lymphocytes Absolute: 1.9 10*3/uL (ref 0.7–3.1)
MCH: 28.8 pg (ref 26.6–33.0)
MCHC: 33.2 g/dL (ref 31.5–35.7)
MCV: 87 fL (ref 79–97)
Monocytes Absolute: 0.8 10*3/uL (ref 0.1–0.9)
Monocytes: 9 %
NEUTROS PCT: 64 %
Neutrophils Absolute: 6 10*3/uL (ref 1.4–7.0)
Platelets: 245 10*3/uL (ref 150–379)
RBC: 4.51 x10E6/uL (ref 4.14–5.80)
RDW: 13.9 % (ref 12.3–15.4)
WBC: 9.4 10*3/uL (ref 3.4–10.8)

## 2013-11-07 LAB — TSH: TSH: 0.985 u[IU]/mL (ref 0.450–4.500)

## 2013-11-07 LAB — D-DIMER, QUANTITATIVE: D-Dimer: 0.76 mg/L FEU — ABNORMAL HIGH (ref 0.00–0.49)

## 2013-11-08 ENCOUNTER — Telehealth: Payer: Self-pay

## 2013-11-08 DIAGNOSIS — I2699 Other pulmonary embolism without acute cor pulmonale: Secondary | ICD-10-CM

## 2013-11-08 NOTE — Telephone Encounter (Signed)
Pt called, states "Dr. Caryl Comes has cut all my medications, and only gave me 1 yesterday, am I going to get anymore?" Please call.

## 2013-11-08 NOTE — Telephone Encounter (Signed)
No answer/no vm 10/22

## 2013-11-09 ENCOUNTER — Telehealth: Payer: Self-pay | Admitting: Physician Assistant

## 2013-11-09 ENCOUNTER — Ambulatory Visit: Payer: Self-pay | Admitting: Internal Medicine

## 2013-11-09 NOTE — Telephone Encounter (Signed)
Message copied by Tracie Harrier on Fri Nov 09, 2013  4:47 PM ------      Message from: Deboraha Sprang      Created: Thu Nov 08, 2013  2:50 PM       Please Inform Patient that  D-dimer is abnormal and as we had no other explanation for his sinus tachycardia weshould undertake a CT scan to exclude pulmonary embolism      Thanks       ------

## 2013-11-09 NOTE — Telephone Encounter (Signed)
Patient returned call  Scheduled for chest PE protocol today asap  Dr. Caryl Comes is aware that [facility] will be calling results to him via cell phone after his CT

## 2013-11-09 NOTE — Telephone Encounter (Signed)
Patient had a CTA at [facility]. Results were abnormal and report called.  Small non-occlusive PE, may be chronic. No occlusive filling defects. Densely calcified left main and LAD.   Spoke w/ KN, she recommended ASA 325 mg, but felt [facility] should make the final decision regarding full anticoagulation.   Called the patient and updated him on the results. He is agreeable to taking the ASA. Saddened by no obvious cause for his cough, but will wait to hear from Dr. Caryl Comes regarding further evaluation and treatment.  Rosaria Ferries, PA-C 11/09/2013 8:48 PM Beeper [phone]

## 2013-11-12 ENCOUNTER — Other Ambulatory Visit: Payer: Self-pay

## 2013-11-12 DIAGNOSIS — I2699 Other pulmonary embolism without acute cor pulmonale: Secondary | ICD-10-CM

## 2013-11-13 ENCOUNTER — Telehealth: Payer: Self-pay | Admitting: Internal Medicine

## 2013-11-13 NOTE — Telephone Encounter (Signed)
Pt had questions about his meds. Please call pt back

## 2013-11-14 NOTE — Telephone Encounter (Signed)
No answer /no vm 10/28

## 2013-11-15 NOTE — Telephone Encounter (Signed)
Reviewed medication instructions regarding diltiazem from his last visit Patient verbalized understanding   Patient also stated the Rosaria Ferries PA placed him on aspirin after his CT  He states he is bleeding from his stoma and does not think he can take aspirin   Copy of note regaurding aspirin:    Patient had a CTA at [facility]. Results were abnormal and report called.  Small non-occlusive PE, may be chronic. No occlusive filling defects. Densely calcified left main and LAD.  Spoke w/ KN, she recommended ASA 325 mg, but felt SK should make the final decision regarding full anticoagulation.  Called the patient and updated him on the results. He is agreeable to taking the ASA. Saddened by no obvious cause for his cough, but will wait to hear from Dr. Caryl Comes regarding further evaluation and treatment.  Rosaria Ferries, PA-C  11/09/2013  8:48 PM  Beeper [phone]

## 2013-11-20 ENCOUNTER — Telehealth: Payer: Self-pay | Admitting: *Deleted

## 2013-11-20 NOTE — Telephone Encounter (Signed)
Informed patient of Dr. Aquilla Hacker instruction  Patient verbalized understanding

## 2013-11-20 NOTE — Telephone Encounter (Signed)
-----   Message from Stanton Kidney, RN sent at 11/20/2013  2:27 PM EST ----- Dr. Caryl Comes says to have pt contact PCP for them to arrange sleep study.   ----- Message -----    From: Tracie Harrier, RN    Sent: 11/20/2013  10:37 AM      To: Stanton Kidney, RN, Deboraha Sprang, MD  I can not find the MD that you wanted him to see for a sleep study. You said Dr. Hardin Negus I think? Where is he located?

## 2013-11-23 NOTE — Telephone Encounter (Signed)
Informed patient per Dr. Caryl Comes  Patient can stop Aspirin

## 2013-12-27 ENCOUNTER — Encounter (HOSPITAL_COMMUNITY): Payer: Self-pay | Admitting: Internal Medicine

## 2014-02-27 ENCOUNTER — Emergency Department: Payer: Self-pay | Admitting: Student

## 2014-03-12 ENCOUNTER — Emergency Department: Payer: Self-pay | Admitting: Emergency Medicine

## 2014-04-10 ENCOUNTER — Other Ambulatory Visit: Payer: Self-pay | Admitting: *Deleted

## 2014-04-10 MED ORDER — DILTIAZEM HCL ER COATED BEADS 120 MG PO CP24: 120.0000 mg | ORAL_CAPSULE | Freq: Every day | ORAL | Status: DC

## 2014-04-24 ENCOUNTER — Other Ambulatory Visit: Payer: Self-pay | Admitting: *Deleted

## 2014-04-24 MED ORDER — DILTIAZEM HCL ER COATED BEADS 120 MG PO CP24: 120.0000 mg | ORAL_CAPSULE | Freq: Every day | ORAL | Status: DC
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
## 2014-08-20 ENCOUNTER — Ambulatory Visit: Payer: Medicare HMO | Admitting: Internal Medicine

## 2014-08-20 ENCOUNTER — Encounter: Payer: Self-pay | Admitting: *Deleted

## 2014-08-29 ENCOUNTER — Emergency Department (HOSPITAL_COMMUNITY)
Admission: EM | Admit: 2014-08-29 | Discharge: 2014-08-29 | Disposition: A | Discharge status: Home / Self Care | Payer: Medicare HMO | Attending: Emergency Medicine | Admitting: Emergency Medicine

## 2014-08-29 ENCOUNTER — Encounter (HOSPITAL_COMMUNITY): Payer: Self-pay | Admitting: *Deleted

## 2014-08-29 ENCOUNTER — Emergency Department (HOSPITAL_COMMUNITY): Payer: Medicare HMO

## 2014-08-29 DIAGNOSIS — Z8546 Personal history of malignant neoplasm of prostate: Secondary | ICD-10-CM | POA: Insufficient documentation

## 2014-08-29 DIAGNOSIS — I4892 Unspecified atrial flutter: Secondary | ICD-10-CM | POA: Insufficient documentation

## 2014-08-29 DIAGNOSIS — J449 Chronic obstructive pulmonary disease, unspecified: Secondary | ICD-10-CM | POA: Insufficient documentation

## 2014-08-29 DIAGNOSIS — Y9289 Other specified places as the place of occurrence of the external cause: Secondary | ICD-10-CM | POA: Diagnosis not present

## 2014-08-29 DIAGNOSIS — Z87891 Personal history of nicotine dependence: Secondary | ICD-10-CM | POA: Insufficient documentation

## 2014-08-29 DIAGNOSIS — E119 Type 2 diabetes mellitus without complications: Secondary | ICD-10-CM | POA: Insufficient documentation

## 2014-08-29 DIAGNOSIS — W06XXXA Fall from bed, initial encounter: Secondary | ICD-10-CM | POA: Insufficient documentation

## 2014-08-29 DIAGNOSIS — F329 Major depressive disorder, single episode, unspecified: Secondary | ICD-10-CM | POA: Diagnosis not present

## 2014-08-29 DIAGNOSIS — Z880 Allergy status to penicillin: Secondary | ICD-10-CM | POA: Diagnosis not present

## 2014-08-29 DIAGNOSIS — Z79899 Other long term (current) drug therapy: Secondary | ICD-10-CM | POA: Diagnosis not present

## 2014-08-29 DIAGNOSIS — Y9389 Activity, other specified: Secondary | ICD-10-CM | POA: Insufficient documentation

## 2014-08-29 DIAGNOSIS — I10 Essential (primary) hypertension: Secondary | ICD-10-CM | POA: Diagnosis not present

## 2014-08-29 DIAGNOSIS — S199XXD Unspecified injury of neck, subsequent encounter: Secondary | ICD-10-CM | POA: Insufficient documentation

## 2014-08-29 DIAGNOSIS — Y998 Other external cause status: Secondary | ICD-10-CM | POA: Diagnosis not present

## 2014-08-29 DIAGNOSIS — Z8719 Personal history of other diseases of the digestive system: Secondary | ICD-10-CM | POA: Diagnosis not present

## 2014-08-29 HISTORY — DX: Chronic obstructive pulmonary disease, unspecified: J44.9

## 2014-08-29 LAB — COMPREHENSIVE METABOLIC PANEL
ALK PHOS: 48 U/L (ref 38–126)
ALT: 11 U/L — ABNORMAL LOW (ref 17–63)
ANION GAP: 10 (ref 5–15)
AST: 19 U/L (ref 15–41)
Albumin: 3.5 g/dL (ref 3.5–5.0)
BILIRUBIN TOTAL: 0.4 mg/dL (ref 0.3–1.2)
BUN: 17 mg/dL (ref 6–20)
CHLORIDE: 105 mmol/L (ref 101–111)
CO2: 25 mmol/L (ref 22–32)
Calcium: 8.8 mg/dL — ABNORMAL LOW (ref 8.9–10.3)
Creatinine, Ser: 0.84 mg/dL (ref 0.61–1.24)
GFR calc Af Amer: >60 mL/min (ref >60)
GLUCOSE: 136 mg/dL — AB (ref 65–99)
POTASSIUM: 3.3 mmol/L — AB (ref 3.5–5.1)
Sodium: 140 mmol/L (ref 135–145)
Total Protein: 6.5 g/dL (ref 6.5–8.1)

## 2014-08-29 LAB — CBC WITH DIFFERENTIAL/PLATELET
Basophils Absolute: 0.1 10*3/uL (ref 0.0–0.1)
Basophils Relative: 1 % (ref 0–1)
Eosinophils Absolute: 0.3 10*3/uL (ref 0.0–0.7)
Eosinophils Relative: 4 % (ref 0–5)
HCT: 38.1 % — ABNORMAL LOW (ref 39.0–52.0)
HEMOGLOBIN: 12.7 g/dL — AB (ref 13.0–17.0)
Lymphocytes Relative: 32 % (ref 12–46)
Lymphs Abs: 2.5 10*3/uL (ref 0.7–4.0)
MCH: 28.7 pg (ref 26.0–34.0)
MCHC: 33.3 g/dL (ref 30.0–36.0)
MCV: 86.2 fL (ref 78.0–100.0)
MONOS PCT: 10 % (ref 3–12)
Monocytes Absolute: 0.8 10*3/uL (ref 0.1–1.0)
NEUTROS ABS: 4.2 10*3/uL (ref 1.7–7.7)
NEUTROS PCT: 53 % (ref 43–77)
Platelets: 192 10*3/uL (ref 150–400)
RBC: 4.42 MIL/uL (ref 4.22–5.81)
RDW: 13.4 % (ref 11.5–15.5)
WBC: 7.8 10*3/uL (ref 4.0–10.5)

## 2014-08-29 LAB — URINALYSIS, ROUTINE W REFLEX MICROSCOPIC
Bilirubin Urine: NEGATIVE
Glucose, UA: NEGATIVE mg/dL
Hgb urine dipstick: NEGATIVE
Ketones, ur: NEGATIVE mg/dL
LEUKOCYTES UA: NEGATIVE
Nitrite: NEGATIVE
PROTEIN: NEGATIVE mg/dL
UROBILINOGEN UA: 0.2 mg/dL (ref 0.0–1.0)
pH: 5.5 (ref 5.0–8.0)

## 2014-08-29 LAB — CK: CK TOTAL: 40 U/L — AB (ref 49–397)

## 2014-08-29 NOTE — ED Notes (Signed)
Dr Roxanne Mins at bedside,

## 2014-08-29 NOTE — ED Provider Notes (Signed)
CSN: [id number]     Arrival date & time 08/29/14  0006 History   This chart was scribed for Rodney Fuel, MD by [name], ED Scribe. This patient was seen in room APA18/APA18 and the patient's care was started at 12:23 AM.    Chief Complaint  Patient presents with  . Fall   Patient is a 69 y.o. male presenting with fall. The history is provided by the patient. No language interpreter was used.  Fall   HPI Comments: [name] is a 69 y.o. male with PMHx listed below brought in by ambulance, who presents to the Emergency Department complaining of fall onset tonight. Pt states that he has a Hx of falling for several years. Pt states that tonight that he rolled out of bed while sleeping. Pt is complaining of neck pain. Pt states that he is unsure if he hit his head. Pt doesn't report any other symptoms.   Past Medical History  Diagnosis Date  . Atrial flutter     s/p ablation 08-2013  . Nonsustained ventricular tachycardia     no clinical significance at present  . S/P hip replacement     remote  . Asthma   . Prostate cancer   . Depression   . Hypertension   . 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 Ulcerative colitis     HX OF   . COPD (chronic obstructive pulmonary disease)    Past Surgical History  Procedure Laterality Date  . Ileostomy    . Back surgery    . Hip surgery Right   . Tonsillectomy    . Ablation  09/07/2013    CTI ablation by Dr Caryl Comes  . Atrial flutter ablation N/A 09/07/2013    Procedure: ATRIAL FLUTTER ABLATION;  Surgeon: Deboraha Sprang, MD;  Location[facility] CATH LAB;  Service: Cardiovascular;  Laterality: N/A;   Family History  Problem Relation Age of Onset  . Heart attack Father    Social History  Substance Use Topics  . Smoking status: Former Smoker -- 3.00 packs/day for 10 years    Types: Cigarettes    Quit date: 12/03/1988  . Smokeless tobacco: Never Used  . Alcohol Use: No    Review of Systems  Musculoskeletal: Positive  for neck pain.  All other systems reviewed and are negative.   Allergies  Erythromycin; Morphine; Penicillins; and Sulfonamide derivatives  Home Medications   Prior to Admission medications   Medication Sig Start Date End Date Taking? Authorizing Provider  acetaminophen (TYLENOL) 500 MG tablet Take 1,000 mg by mouth every 6 (six) hours as needed for moderate pain or headache.    Historical Provider, MD  beclomethasone (QVAR) 80 MCG/ACT inhaler Inhale 2 puffs into the lungs as needed.     Historical Provider, MD  cetirizine (ZYRTEC) 10 MG tablet Take 10 mg by mouth daily.    Historical Provider, MD  cyclobenzaprine (FLEXERIL) 10 MG tablet Take 5 mg by mouth at bedtime as needed for muscle spasms.     Historical Provider, MD  diltiazem (CARDIZEM CD) 120 MG 24 hr capsule Take 1 capsule (120 mg total) by mouth daily. 04/24/14   Deboraha Sprang, MD  Loperamide HCl (ANTI-DIARRHEAL PO) Take 1-3 tablets by mouth daily as needed (for diahrrea).    Historical Provider, MD  metFORMIN (GLUCOPHAGE) 500 MG tablet Take 500 mg by mouth 2 (two) times daily with a meal.     Historical Provider, MD  simethicone (GAS RELIEF) 80 MG chewable tablet Chew 80-160 mg by mouth every 6 (six) hours as needed for flatulence.    Historical Provider, MD  Thiamine HCl (VITAMIN B-1) 250 MG tablet Take 250 mg by mouth daily.    Historical Provider, MD  TRELSTAR MIXJECT 22.5 MG injection Inject 22.5 mg into the muscle every 6 (six) months.  11/10/11   Historical Provider, MD  venlafaxine (EFFEXOR) 75 MG tablet Take 75 mg by mouth daily.    Historical Provider, MD  vitamin C (ASCORBIC ACID) 500 MG tablet Take 500 mg by mouth daily.    Historical Provider, MD   BP 138/69 mmHg  Pulse 73  Temp(Src) 97.8 F (36.6 C) (Oral)  Resp 16  Ht 5' 9.5" (1.765 m)  Wt 197 lb (89.359 kg)  BMI 28.68 kg/m2  SpO2 96%   Physical Exam  Constitutional: He is oriented to person, place, and time. He appears well-developed and well-nourished. No  distress.  HENT:  Head: Normocephalic and atraumatic.  Eyes: Conjunctivae and EOM are normal. Pupils are equal, round, and reactive to light.  Neck: No JVD present.  Mild tenderness of right paracervical muscles.  Cardiovascular: Normal rate, regular rhythm and normal heart sounds.   No murmur heard. Pulmonary/Chest: Effort normal and breath sounds normal. He has no wheezes. He has no rales. He exhibits no tenderness.  Abdominal: Soft. Bowel sounds are normal. He exhibits no distension and no mass. There is no tenderness.  Musculoskeletal: Normal range of motion. He exhibits no edema.  Mild pain with ROM of lower extremities no swelling or deformity, no tenderness to direct palpation   Lymphadenopathy:    He has no cervical adenopathy.  Neurological: He is alert and oriented to person, place, and time. No cranial nerve deficit. He exhibits normal muscle tone. Coordination normal.  Skin: Skin is warm and dry. No rash noted.  Psychiatric: He has a normal mood and affect. His behavior is normal.  Nursing note and vitals reviewed.   ED Course  Procedures (including critical care time) DIAGNOSTIC STUDIES: Oxygen Saturation is 96% on RA, adequate by my interpretation.    COORDINATION OF CARE: 12:28 AM-Discussed treatment plan with pt at bedside and pt agreed to plan.     Labs Review Results for orders placed or performed during the hospital encounter of 08/29/14  Comprehensive metabolic panel  Result Value Ref Range   Sodium 140 135 - 145 mmol/L   Potassium 3.3 (L) 3.5 - 5.1 mmol/L   Chloride 105 101 - 111 mmol/L   CO2 25 22 - 32 mmol/L   Glucose, Bld 136 (H) 65 - 99 mg/dL   BUN 17 6 - 20 mg/dL   Creatinine, Ser 0.84 0.61 - 1.24 mg/dL   Calcium 8.8 (L) 8.9 - 10.3 mg/dL   Total Protein 6.5 6.5 - 8.1 g/dL   Albumin 3.5 3.5 - 5.0 g/dL   AST 19 15 - 41 U/L   ALT 11 (L) 17 - 63 U/L   Alkaline Phosphatase 48 38 - 126 U/L   Total Bilirubin 0.4 0.3 - 1.2 mg/dL   GFR calc non Af Amer  >60 >60 mL/min   GFR calc Af Amer >60 >60 mL/min   Anion gap 10 5 - 15  CBC with Differential  Result Value Ref Range   WBC 7.8 4.0 - 10.5 K/uL   RBC 4.42 4.22 - 5.81 MIL/uL   Hemoglobin 12.7 (L) 13.0 - 17.0 g/dL   HCT 38.1 (L) 39.0 -  52.0 %   MCV 86.2 78.0 - 100.0 fL   MCH 28.7 26.0 - 34.0 pg   MCHC 33.3 30.0 - 36.0 g/dL   RDW 13.4 11.5 - 15.5 %   Platelets 192 150 - 400 K/uL   Neutrophils Relative % 53 43 - 77 %   Neutro Abs 4.2 1.7 - 7.7 K/uL   Lymphocytes Relative 32 12 - 46 %   Lymphs Abs 2.5 0.7 - 4.0 K/uL   Monocytes Relative 10 3 - 12 %   Monocytes Absolute 0.8 0.1 - 1.0 K/uL   Eosinophils Relative 4 0 - 5 %   Eosinophils Absolute 0.3 0.0 - 0.7 K/uL   Basophils Relative 1 0 - 1 %   Basophils Absolute 0.1 0.0 - 0.1 K/uL  Urinalysis, Routine w reflex microscopic (not at [facility])  Result Value Ref Range   Color, Urine YELLOW YELLOW   APPearance CLEAR CLEAR   Specific Gravity, Urine >1.030 (H) 1.005 - 1.030   pH 5.5 5.0 - 8.0   Glucose, UA NEGATIVE NEGATIVE mg/dL   Hgb urine dipstick NEGATIVE NEGATIVE   Bilirubin Urine NEGATIVE NEGATIVE   Ketones, ur NEGATIVE NEGATIVE mg/dL   Protein, ur NEGATIVE NEGATIVE mg/dL   Urobilinogen, UA 0.2 0.0 - 1.0 mg/dL   Nitrite NEGATIVE NEGATIVE   Leukocytes, UA NEGATIVE NEGATIVE  CK  Result Value Ref Range   Total CK 40 (L) 49 - 397 U/L   Imaging Review Ct Head Wo Contrast  08/29/2014   CLINICAL DATA:  Rolled out of bed while sleeping, fall. Neck pain. Possible head injury. History of hypertension, prostate cancer, atrial flutter.  EXAM: CT HEAD WITHOUT CONTRAST  CT CERVICAL SPINE WITHOUT CONTRAST  TECHNIQUE: Multidetector CT imaging of the head and cervical spine was performed following the standard protocol without intravenous contrast. Multiplanar CT image reconstructions of the cervical spine were also generated.  COMPARISON:  CT head March 13, 2014  FINDINGS: CT HEAD FINDINGS  Moderate to severe ventriculomegaly, stable from prior  examination with proportional enlargement of cerebral sulci and cerebellar folia. No intraparenchymal hemorrhage, mass effect, midline shift or acute large vascular territory infarcts. Patchy supratentorial white matter hypodensities are similar.  No abnormal extra-axial fluid collections. Moderate calcific atherosclerosis, severe calcific atherosclerosis the included vertebral arteries.  Status post bilateral ocular lens implants. Chronic paranasal sinus cystitis, status post FESS with pan paranasal sinus mucosal thickening partially imaged. The imaged mastoid air cells are well aerated. No skull fracture.  CT CERVICAL SPINE FINDINGS  Cervical vertebral bodies and posterior elements intact, broad reversed cervical lordosis. Grade 1 C7-T1 anterolisthesis without spondylolysis. Severe C4-5 through C6-7 disc height loss, uncovertebral hypertrophy and endplate spurring consistent with degenerative discs. Moderate upper cervical facet arthropathy. Severe RIGHT greater than LEFT C3-4, C4-5, C5-6 neural foraminal narrowing, moderate to severe at C6-7. C1-2 articulation maintained with moderate arthropathy. No destructive bony lesions. No prevertebral soft tissue swelling. Moderate calcific atherosclerosis of the carotid bulbs.  IMPRESSION: CT HEAD: No acute intracranial process.  Moderate to severe global brain atrophy, stable from prior examination. Moderate chronic small vessel ischemic disease.  CT CERVICAL SPINE: No acute fracture. Grade 1 C7-T1 anterolisthesis on degenerative basis.  Multilevel severe neural foraminal narrowing.   Electronically Signed   By: Elon Alas M.D.   On: 08/29/2014 01:45   Ct Cervical Spine Wo Contrast  08/29/2014   CLINICAL DATA:  Rolled out of bed while sleeping, fall. Neck pain. Possible head injury. History of hypertension, prostate cancer, atrial flutter.  EXAM: CT HEAD WITHOUT CONTRAST  CT CERVICAL SPINE WITHOUT CONTRAST  TECHNIQUE: Multidetector CT imaging of the head and  cervical spine was performed following the standard protocol without intravenous contrast. Multiplanar CT image reconstructions of the cervical spine were also generated.  COMPARISON:  CT head March 13, 2014  FINDINGS: CT HEAD FINDINGS  Moderate to severe ventriculomegaly, stable from prior examination with proportional enlargement of cerebral sulci and cerebellar folia. No intraparenchymal hemorrhage, mass effect, midline shift or acute large vascular territory infarcts. Patchy supratentorial white matter hypodensities are similar.  No abnormal extra-axial fluid collections. Moderate calcific atherosclerosis, severe calcific atherosclerosis the included vertebral arteries.  Status post bilateral ocular lens implants. Chronic paranasal sinus cystitis, status post FESS with pan paranasal sinus mucosal thickening partially imaged. The imaged mastoid air cells are well aerated. No skull fracture.  CT CERVICAL SPINE FINDINGS  Cervical vertebral bodies and posterior elements intact, broad reversed cervical lordosis. Grade 1 C7-T1 anterolisthesis without spondylolysis. Severe C4-5 through C6-7 disc height loss, uncovertebral hypertrophy and endplate spurring consistent with degenerative discs. Moderate upper cervical facet arthropathy. Severe RIGHT greater than LEFT C3-4, C4-5, C5-6 neural foraminal narrowing, moderate to severe at C6-7. C1-2 articulation maintained with moderate arthropathy. No destructive bony lesions. No prevertebral soft tissue swelling. Moderate calcific atherosclerosis of the carotid bulbs.  IMPRESSION: CT HEAD: No acute intracranial process.  Moderate to severe global brain atrophy, stable from prior examination. Moderate chronic small vessel ischemic disease.  CT CERVICAL SPINE: No acute fracture. Grade 1 C7-T1 anterolisthesis on degenerative basis.  Multilevel severe neural foraminal narrowing.   Electronically Signed   By: Elon Alas M.D.   On: 08/29/2014 01:45   Images viewed by  me.  MDM   Final diagnoses:  Fall from bed, initial encounter  Fall from bed, initial encounter       Fall from bed with no obvious injury seen. However, with history of multiple falls, screening labs will be obtained to look for potential correctable causes.  CT showed no evidence of significant injury. Screening labs are unremarkable. He is discharged with instructions to follow-up with PCP.   I personally performed the services described in this documentation, which was scribed in my presence. The recorded information has been reviewed and is accurate.       Rodney Fuel, MD 75/30/05 1102

## 2014-08-29 NOTE — ED Notes (Signed)
Patient with no complaints at this time. Respirations even and unlabored. Skin warm/dry. Discharge instructions reviewed with patient at this time. Patient given opportunity to voice concerns/ask questions. Patient discharged at this time and left Emergency Department via wheelchair. Patient left with caregiver from [facility].

## 2014-08-29 NOTE — ED Notes (Signed)
Pt ambulatory to restroom with assistance, tolerated well, update given,

## 2014-08-29 NOTE — ED Notes (Signed)
Awaiting transport from patient's home facility. Patient resting, no distress.

## 2014-08-29 NOTE — ED Notes (Signed)
Pt ambulatory to restroom with assistance, tolerated well,

## 2014-08-29 NOTE — ED Notes (Signed)
Pt is a resident of [facility], was asleep tonight and rolled out of bed, initially denied any injury but while enroute to er started to complain of neck pain, staff is concerned due to frequency of falls that pt is having, pt states that he always has problems with falling,

## 2014-08-29 NOTE — ED Notes (Signed)
[facility] contacted, [phone], advised that it may be after 06:30 before they can pick pt up.

## 2014-08-29 NOTE — ED Notes (Signed)
Pt sleeping, will arouse, given warm blanket,

## 2014-08-29 NOTE — ED Notes (Signed)
Pt repositioned per request, update given, unable to give urine sample at present time,

## 2014-08-29 NOTE — Discharge Instructions (Signed)

## 2015-02-28 ENCOUNTER — Emergency Department
Admission: EM | Admit: 2015-02-28 | Discharge: 2015-03-01 | Disposition: A | Discharge status: Other Acute Inpatient Hospital | Payer: Medicare HMO | Attending: Emergency Medicine | Admitting: Emergency Medicine

## 2015-02-28 ENCOUNTER — Encounter: Payer: Self-pay | Admitting: Emergency Medicine

## 2015-02-28 ENCOUNTER — Emergency Department: Payer: Medicare HMO

## 2015-02-28 DIAGNOSIS — R443 Hallucinations, unspecified: Secondary | ICD-10-CM | POA: Diagnosis present

## 2015-02-28 DIAGNOSIS — Z79899 Other long term (current) drug therapy: Secondary | ICD-10-CM | POA: Diagnosis not present

## 2015-02-28 DIAGNOSIS — Z880 Allergy status to penicillin: Secondary | ICD-10-CM | POA: Insufficient documentation

## 2015-02-28 DIAGNOSIS — Z87891 Personal history of nicotine dependence: Secondary | ICD-10-CM | POA: Insufficient documentation

## 2015-02-28 DIAGNOSIS — I10 Essential (primary) hypertension: Secondary | ICD-10-CM | POA: Diagnosis not present

## 2015-02-28 DIAGNOSIS — E119 Type 2 diabetes mellitus without complications: Secondary | ICD-10-CM | POA: Diagnosis not present

## 2015-02-28 DIAGNOSIS — F331 Major depressive disorder, recurrent, moderate: Secondary | ICD-10-CM

## 2015-02-28 LAB — CBC WITH DIFFERENTIAL/PLATELET
Basophils Absolute: 0.1 10*3/uL (ref 0–0.1)
Basophils Relative: 1 %
Eosinophils Absolute: 0.3 10*3/uL (ref 0–0.7)
Eosinophils Relative: 2 %
HEMATOCRIT: 39.8 % — AB (ref 40.0–52.0)
HEMOGLOBIN: 13.1 g/dL (ref 13.0–18.0)
LYMPHS ABS: 2.1 10*3/uL (ref 1.0–3.6)
LYMPHS PCT: 19 %
MCH: 28.4 pg (ref 26.0–34.0)
MCHC: 32.9 g/dL (ref 32.0–36.0)
MCV: 86.3 fL (ref 80.0–100.0)
MONOS PCT: 8 %
Monocytes Absolute: 0.9 10*3/uL (ref 0.2–1.0)
NEUTROS ABS: 7.9 10*3/uL — AB (ref 1.4–6.5)
NEUTROS PCT: 70 %
Platelets: 201 10*3/uL (ref 150–440)
RBC: 4.61 MIL/uL (ref 4.40–5.90)
RDW: 13.7 % (ref 11.5–14.5)
WBC: 11.3 10*3/uL — ABNORMAL HIGH (ref 3.8–10.6)

## 2015-02-28 LAB — URINALYSIS COMPLETE WITH MICROSCOPIC (ARMC ONLY)
BACTERIA UA: NONE SEEN
Bilirubin Urine: NEGATIVE
Glucose, UA: 50 mg/dL — AB
HGB URINE DIPSTICK: NEGATIVE
NITRITE: NEGATIVE
PH: 5 (ref 5.0–8.0)
Protein, ur: NEGATIVE mg/dL
SPECIFIC GRAVITY, URINE: 1.023 (ref 1.005–1.030)

## 2015-02-28 LAB — URINE DRUG SCREEN, QUALITATIVE (ARMC ONLY)
Amphetamines, Ur Screen: NOT DETECTED
Barbiturates, Ur Screen: NOT DETECTED
Benzodiazepine, Ur Scrn: NOT DETECTED
CANNABINOID 50 NG, UR ~~LOC~~: NOT DETECTED
COCAINE METABOLITE, UR ~~LOC~~: NOT DETECTED
MDMA (ECSTASY) UR SCREEN: NOT DETECTED
METHADONE SCREEN, URINE: NOT DETECTED
OPIATE, UR SCREEN: NOT DETECTED
Phencyclidine (PCP) Ur S: NOT DETECTED
TRICYCLIC, UR SCREEN: NOT DETECTED

## 2015-02-28 LAB — BASIC METABOLIC PANEL
Anion gap: 7 (ref 5–15)
BUN: 20 mg/dL (ref 6–20)
CHLORIDE: 102 mmol/L (ref 101–111)
CO2: 27 mmol/L (ref 22–32)
CREATININE: 0.83 mg/dL (ref 0.61–1.24)
Calcium: 9.2 mg/dL (ref 8.9–10.3)
GFR calc Af Amer: >60 mL/min (ref >60)
GFR calc non Af Amer: >60 mL/min (ref >60)
Glucose, Bld: 124 mg/dL — ABNORMAL HIGH (ref 65–99)
Potassium: 3.9 mmol/L (ref 3.5–5.1)
Sodium: 136 mmol/L (ref 135–145)

## 2015-02-28 LAB — BLOOD GAS, VENOUS
Acid-Base Excess: 3.2 mmol/L — ABNORMAL HIGH (ref 0.0–3.0)
Bicarbonate: 27.9 mEq/L (ref 21.0–28.0)
FIO2: 21
PATIENT TEMPERATURE: 37
PCO2 VEN: 42 mmHg — AB (ref 44.0–60.0)
PH VEN: 7.43 (ref 7.320–7.430)

## 2015-02-28 LAB — HEPATIC FUNCTION PANEL
ALK PHOS: 61 U/L (ref 38–126)
ALT: 14 U/L — AB (ref 17–63)
AST: 18 U/L (ref 15–41)
Albumin: 3.9 g/dL (ref 3.5–5.0)
Total Bilirubin: 0.5 mg/dL (ref 0.3–1.2)
Total Protein: 6.8 g/dL (ref 6.5–8.1)

## 2015-02-28 LAB — PROTIME-INR
INR: 1.13
Prothrombin Time: 14.7 seconds (ref 11.4–15.0)

## 2015-02-28 LAB — BRAIN NATRIURETIC PEPTIDE: B Natriuretic Peptide: 21 pg/mL (ref 0.0–100.0)

## 2015-02-28 LAB — TROPONIN I
TROPONIN I: 0.03 ng/mL (ref <0.031)
TROPONIN I: 0.03 ng/mL (ref <0.031)

## 2015-02-28 LAB — ACETAMINOPHEN LEVEL: Acetaminophen (Tylenol), Serum: <10 ug/mL — ABNORMAL LOW (ref 10–30)

## 2015-02-28 LAB — SALICYLATE LEVEL: Salicylate Lvl: <4 mg/dL (ref 2.8–30.0)

## 2015-02-28 LAB — AMMONIA: AMMONIA: 9 umol/L (ref 9–35)

## 2015-02-28 NOTE — ED Notes (Signed)
Pt states he is from an assisted living facility and was dropped off by a nurse. Pt states for about a month he gets short of breath after walking short distances, complains of dizziness and states he has had some hallucinations. Pt states he will see dogs walking through but knows they aren't there.

## 2015-02-28 NOTE — ED Provider Notes (Addendum)
[facility] Emergency Department Provider Note  ____________________________________________   I have reviewed the triage vital signs and the nursing notes.   HISTORY  Chief Complaint Shortness of Breath and Dizziness    HPI [name] is a 70 y.o. male patient with very vague history. He was sent here for shortness of breath but he states he sometimes gets short of breath but isn't now. He has had no leg swelling. He states that "sometimes" he gets short of breath for Further elicited. Very limited history. He states sometimes he feels that his heart rate is irregular. He does have a history of atrial fibrillation with an ablation in the past. He denies any chest pain. Denies any nausea or vomiting. He is 81 and sinus on the rhythm strip and he tells me he still feels an irregular heartbeat. There is no evidence of that noted. Equally concerning however is the patient's complaint of hallucinations. He states he feels that there are dogs in a row he knows that there are not toxic. He also has some passive thoughts of perhaps hurting himself some time. He feels that this is because of the trazodone. He denies any history of significant psychiatric history. He states that sometimes he feels depressed and otherwise he has not had any auditory hallucinations. However, he does feel that perhaps her people seeking around the facility where he stays.  Past Medical History  Diagnosis Date  . Atrial flutter ([place])     s/p ablation 08-2013  . Nonsustained ventricular tachycardia (H[facility])     no clinical significance at present  . S/P hip replacement     remote  . Asthma   . Prostate cancer ([place])   . Depression   . Hypertension   . Diabetes mellitus without complication ([place])     TYPE 2   . Ulcerative colitis ([place])     HX OF   . COPD (chronic obstructive pulmonary disease) [facility])     Patient Active Problem List   Diagnosis Date Noted  . Iatrogenic hypotension  02/20/2013  . Ventricular tachycardia-nonsustained 02/20/2013  . BRONCHITIS NOT SPECIFIED AS ACUTE OR CHRONIC 10/03/2009  . DYSLIPIDEMIA 09/30/2008  . ESSENTIAL HYPERTENSION, BENIGN 09/30/2008  . Atrial flutter ([place]) 09/30/2008  . PALPITATIONS 09/30/2008    Past Surgical History  Procedure Laterality Date  . Ileostomy    . Back surgery    . Hip surgery Right   . Tonsillectomy    . Ablation  09/07/2013    CTI ablation by Dr Caryl Comes  . Atrial flutter ablation N/A 09/07/2013    Procedure: ATRIAL FLUTTER ABLATION;  Surgeon: Deboraha Sprang, MD;  Location: [facility] CATH LAB;  Service: Cardiovascular;  Laterality: N/A;    Current Outpatient Rx  Name  Route  Sig  Dispense  Refill  . atorvastatin (LIPITOR) 10 MG tablet   Oral   Take 10 mg by mouth at bedtime.         . Calcium Carbonate-Vitamin D (CALCIUM 600+D) 600-400 MG-UNIT tablet   Oral   Take 1 tablet by mouth 2 (two) times daily with a meal.         . cetirizine (ZYRTEC) 10 MG tablet   Oral   Take 10 mg by mouth daily.         [name] diltiazem (CARDIZEM CD) 120 MG 24 hr capsule   Oral   Take 1 capsule (120 mg total) by mouth daily.   90 capsule   3   .  metFORMIN (GLUCOPHAGE) 500 MG tablet   Oral   Take 500 mg by mouth 2 (two) times daily with a meal.          . mometasone (ASMANEX) 220 MCG/INH inhaler   Inhalation   Inhale 2 puffs into the lungs 2 (two) times daily.          . traZODone (DESYREL) 100 MG tablet   Oral   Take 100 mg by mouth at bedtime.         [name] MIXJECT 22.5 MG injection   Intramuscular   Inject 22.5 mg into the muscle every 6 (six) months.          . venlafaxine XR (EFFEXOR-XR) 150 MG 24 hr capsule   Oral   Take 150 mg by mouth at bedtime.           Allergies Cefuroxime; Clarithromycin; Erythromycin; Isradipine; Morphine; Penicillins; and Sulfonamide derivatives  Family History  Problem Relation Age of Onset  . Heart attack Father     Social History Social History   Substance Use Topics  . Smoking status: Former Smoker -- 3.00 packs/day for 10 years    Types: Cigarettes    Quit date: 12/03/1988  . Smokeless tobacco: Never Used  . Alcohol Use: No    Review of Systems Constitutional: No fever/chills Eyes: No visual changes. ENT: No sore throat. No stiff neck no neck pain Cardiovascular: Denies chest pain. Respiratory: See history of present illness Gastrointestinal:   no vomiting.  No diarrhea.  No constipation. Genitourinary: Negative for dysuria. Musculoskeletal: Negative lower extremity swelling Skin: Negative for rash. Neurological: Negative for headaches, focal weakness or numbness. 10-point ROS otherwise negative.  ____________________________________________   PHYSICAL EXAM:  VITAL SIGNS: ED Triage Vitals  Enc Vitals Group     BP 02/28/15 1507 135/87 mmHg     Pulse Rate 02/28/15 1507 102     Resp 02/28/15 1507 18     Temp --      Temp src --      SpO2 02/28/15 1507 97 %     Weight 02/28/15 1507 190 lb (86.183 kg)     Height 02/28/15 1507 5' 9"  (1.753 m)     Head Cir --      Peak Flow --      Pain Score 02/28/15 1645 0     Pain Loc --      Pain Edu? --      Excl. in [place]? --     Constitutional: Alert and oriented. Well appearing and in no acute distress. Very limited historian Eyes: Conjunctivae are normal. PERRL. EOMI. Head: Atraumatic. Nose: No congestion/rhinnorhea. Mouth/Throat: Mucous membranes are moist.  Oropharynx non-erythematous. Neck: No stridor.   Nontender with no meningismus Cardiovascular: Normal rate, regular rhythm. Grossly normal heart sounds.  Good peripheral circulation. Respiratory: Normal respiratory effort.  No retractions. Lungs CTAB. Abdominal: Soft and nontender. No distention. No guarding no rebound Back:  There is no focal tenderness or step off there is no midline tenderness there are no lesions noted. there is no CVA tenderness Musculoskeletal: No lower extremity tenderness. No joint  effusions, no DVT signs strong distal pulses no edema Neurologic:  Normal speech and language. No gross focal neurologic deficits are appreciated.  Skin:  Skin is warm, dry and intact. No rash noted. Psychiatric: Mood and affect are somewhat flat. Speech and behavior are bizarre. he does endorse hallucinating..  ____________________________________________   LABS (all labs ordered are listed, but only abnormal results  are displayed)  Labs Reviewed  CBC WITH DIFFERENTIAL/PLATELET - Abnormal; Notable for the following:    WBC 11.3 (*)    HCT 39.8 (*)    Neutro Abs 7.9 (*)    All other components within normal limits  BASIC METABOLIC PANEL - Abnormal; Notable for the following:    Glucose, Bld 124 (*)    All other components within normal limits  BLOOD GAS, VENOUS - Abnormal; Notable for the following:    pCO2, Ven 42 (*)    Acid-Base Excess 3.2 (*)    All other components within normal limits  URINALYSIS COMPLETEWITH MICROSCOPIC ([facility] ONLY) - Abnormal; Notable for the following:    Color, Urine YELLOW (*)    APPearance CLEAR (*)    Glucose, UA 50 (*)    Ketones, ur TRACE (*)    Leukocytes, UA TRACE (*)    Squamous Epithelial / LPF 0-5 (*)    All other components within normal limits  HEPATIC FUNCTION PANEL - Abnormal; Notable for the following:    ALT 14 (*)    Bilirubin, Direct <0.1 (*)    All other components within normal limits  ACETAMINOPHEN LEVEL - Abnormal; Notable for the following:    Acetaminophen (Tylenol), Serum <10 (*)    All other components within normal limits  URINE CULTURE  BRAIN NATRIURETIC PEPTIDE  TROPONIN I  AMMONIA  SALICYLATE LEVEL  URINE DRUG SCREEN, QUALITATIVE ([facility] ONLY)  PROTIME-INR  TROPONIN I   ____________________________________________  EKG  I personally interpreted any EKGs ordered by me or triage Normal sinus rhythm at 81 bpm no acute ST elevation or acute ST depression, borderline  LVH. ____________________________________________  RADIOLOGY  I reviewed any imaging ordered by me or triage that were performed during my shift ____________________________________________   PROCEDURES  Procedure(s) performed: None  Critical Care performed: None  ____________________________________________   INITIAL IMPRESSION / ASSESSMENT AND PLAN / ED COURSE  Pertinent labs & imaging results that were available during my care of the patient were reviewed by me and considered in my medical decision making (see chart for details).  Patient with 2 complaints the first is that sometimes he gets short of breath and sometimes he has palpitations. He is not having either one of them at this time. He ambulates with no difficulty and no evidence of desaturation or hypoxia or respiratory distress. Lungs are clear chest x-ray is reassuring. It is no evidence of CHF there is no evidence of hypoxia or there is no evidence of PE or dissection. More concerning and perhaps of some influences the patient's concern about hallucinations. This is concerning to me. Patient states that as he is hallucinating it makes him feel bad. This is not something he states is a baseline condition for him. I do not see documented in prior notes. I think patient will require a stay in the emergency room to be evaluated by psychiatry for this. He has not tried to kill himself but he states that at times he has had thoughts of doing so in the past. He is not actively suicidal. ____________________________________________  ----------------------------------------- 11:13 PM on 02/28/2015 -----------------------------------------  Since a workup of his shortness of breath is quite reassuring there is no evidence evident this time. Patient states that he actually has not been short of breath recently but he does sometimes feel it. He is a very unreliable historian. We will continue to observe him in the emergency  department. No evidence of ongoing ischemia or PE or other acute  intrathoracic pathology. Patient does have some white cells in his urine but I do not think that is sufficient to cause the complaint of hallucinations. He states he's been hallucinating since him and started him on trazodone a year ago but he is not really sure about how long it has been. He has no ongoing SI or HI. He does not appear to be acutely psychotic. Believe that the best case scenario for this patient will be to watch him overnight to a psychiatrist can evaluate him for his self-described hallucinations. I did talk to someone at the [facility] which he stays, and they indicated the patient does not have any history of hallucinations that they know of. Certainly does not seem to be delirious is no evidence of waxing and waning change in consciousness. Signed out at the end of my shift to Dr. Beather Arbour     FINAL CLINICAL IMPRESSION(S) / ED DIAGNOSES  Final diagnoses:  Hallucinations      This chart was dictated using voice recognition software.  Despite best efforts to proofread,  errors can occur which can change meaning.     Schuyler Amor, MD 02/28/15 1942  Schuyler Amor, MD 02/28/15 3009  Schuyler Amor, MD 02/28/15 2317  Schuyler Amor, MD 02/28/15 [phone]

## 2015-02-28 NOTE — ED Notes (Signed)
PT AMBULATED IN THE HALLWAY WITH HIS WALKER. HIS OXYGEN SATURATION REMAINED BETWEEN 97%-100%. RN NOTIFIED.

## 2015-02-28 NOTE — ED Notes (Signed)
MD at bedside. 

## 2015-03-01 DIAGNOSIS — F331 Major depressive disorder, recurrent, moderate: Secondary | ICD-10-CM

## 2015-03-01 MED ORDER — MOMETASONE FUROATE 220 MCG/INH IN AEPB: 2.0000 | INHALATION_SPRAY | Freq: Two times a day (BID) | RESPIRATORY_TRACT | Status: DC

## 2015-03-01 MED ORDER — LOPERAMIDE HCL 2 MG PO CAPS
ORAL_CAPSULE | ORAL | Status: AC
  Administered 2015-03-01: 2 mg via ORAL
  Filled 2015-03-01: qty 1

## 2015-03-01 MED ORDER — TRAZODONE HCL 100 MG PO TABS: 100.0000 mg | ORAL_TABLET | Freq: Every day | ORAL | Status: DC

## 2015-03-01 MED ORDER — FLUTICASONE PROPIONATE HFA 44 MCG/ACT IN AERO
2.0000 | INHALATION_SPRAY | Freq: Two times a day (BID) | RESPIRATORY_TRACT | Status: DC
  Filled 2015-03-01: qty 10.6

## 2015-03-01 MED ORDER — DILTIAZEM HCL ER COATED BEADS 120 MG PO CP24: 120.0000 mg | ORAL_CAPSULE | Freq: Every day | ORAL | Status: DC

## 2015-03-01 MED ORDER — ATORVASTATIN CALCIUM 20 MG PO TABS: 10.0000 mg | ORAL_TABLET | Freq: Every day | ORAL | Status: DC

## 2015-03-01 MED ORDER — METFORMIN HCL 500 MG PO TABS: 500.0000 mg | ORAL_TABLET | Freq: Two times a day (BID) | ORAL | Status: DC

## 2015-03-01 MED ORDER — VENLAFAXINE HCL ER 150 MG PO CP24
150.0000 mg | ORAL_CAPSULE | Freq: Every day | ORAL | Status: DC
  Filled 2015-03-01: qty 1

## 2015-03-01 MED ORDER — LOPERAMIDE HCL 2 MG PO CAPS
2.0000 mg | ORAL_CAPSULE | Freq: Once | ORAL | Status: AC
  Administered 2015-03-01: 2 mg via ORAL

## 2015-03-01 NOTE — B[facility] Counselor (Signed)
This Probation officer arranged TelePsych with pt and Elmarie Shiley, MD.  TelePsych completed.

## 2015-03-01 NOTE — ED Notes (Signed)
At [place] group home driver called requesting that the pt be ready. Pt dressed and discharged and taken to the lobby and officer made aware.

## 2015-03-01 NOTE — ED Notes (Signed)
Tele psych set up for pt

## 2015-03-01 NOTE — [facility] Counselor (Signed)
This Probation officer called pt's group home [facility] [place]: [phone]) and spoke directly with Noralee Chars (staff member) who reports "I'll get in touch with Lennie Odor (to arrange transportation from hospital to group home)". He states p/u would be "about an hour or so".  TTS called and informed [facility] Nurse Manuela Schwartz, RN).

## 2015-03-01 NOTE — ED Notes (Signed)
Patient belongings taken to [facility] secure room

## 2015-03-01 NOTE — ED Notes (Signed)
[facility] is full, leave pt in room 8 per CN

## 2015-03-01 NOTE — ED Notes (Signed)
Given [place]

## 2015-03-01 NOTE — Discharge Instructions (Signed)
Please seek medical attention and help for any thoughts about wanting to harm herself, harm others, any concerning change in behavior, severe depression, inappropriate drug use or any other new or concerning symptoms. Major Depressive Disorder Major depressive disorder is a mental illness. It also may be called clinical depression or unipolar depression. Major depressive disorder usually causes feelings of sadness, hopelessness, or helplessness. Some people with this disorder do not feel particularly sad but lose interest in doing things they used to enjoy (anhedonia). Major depressive disorder also can cause physical symptoms. It can interfere with work, school, relationships, and other normal everyday activities. The disorder varies in severity but is longer lasting and more serious than the sadness we all feel from time to time in our lives. Major depressive disorder often is triggered by stressful life events or major life changes. Examples of these triggers include divorce, loss of your job or home, a move, and the death of a family member or close friend. Sometimes this disorder occurs for no obvious reason at all. People who have family members with major depressive disorder or bipolar disorder are at higher risk for developing this disorder, with or without life stressors. Major depressive disorder can occur at any age. It may occur just once in your life (single episode major depressive disorder). It may occur multiple times (recurrent major depressive disorder). SYMPTOMS People with major depressive disorder have either anhedonia or depressed mood on nearly a daily basis for at least 2 weeks or longer. Symptoms of depressed mood include:  Feelings of sadness (blue or down in the dumps) or emptiness.  Feelings of hopelessness or helplessness.  Tearfulness or episodes of crying (may be observed by others).  Irritability (children and adolescents). In addition to depressed mood or anhedonia or  both, people with this disorder have at least four of the following symptoms:  Difficulty sleeping or sleeping too much.   Significant change (increase or decrease) in appetite or weight.   Lack of energy or motivation.  Feelings of guilt and worthlessness.   Difficulty concentrating, remembering, or making decisions.  Unusually slow movement (psychomotor retardation) or restlessness (as observed by others).   Recurrent wishes for death, recurrent thoughts of self-harm (suicide), or a suicide attempt. People with major depressive disorder commonly have persistent negative thoughts about themselves, other people, and the world. People with severe major depressive disorder may experiencedistorted beliefs or perceptions about the world (psychotic delusions). They also may see or hear things that are not real (psychotic hallucinations). DIAGNOSIS Major depressive disorder is diagnosed through an assessment by your health care provider. Your health care provider will ask aboutaspects of your daily life, such as mood,sleep, and appetite, to see if you have the diagnostic symptoms of major depressive disorder. Your health care provider may ask about your medical history and use of alcohol or drugs, including prescription medicines. Your health care provider also may do a physical exam and blood work. This is because certain medical conditions and the use of certain substances can cause major depressive disorder-like symptoms (secondary depression). Your health care provider also may refer you to a mental health specialist for further evaluation and treatment. TREATMENT It is important to recognize the symptoms of major depressive disorder and seek treatment. The following treatments can be prescribed for this disorder:   Medicine. Antidepressant medicines usually are prescribed. Antidepressant medicines are thought to correct chemical imbalances in the brain that are commonly associated with  major depressive disorder. Other types of medicine may be  added if the symptoms do not respond to antidepressant medicines alone or if psychotic delusions or hallucinations occur.  Talk therapy. Talk therapy can be helpful in treating major depressive disorder by providing support, education, and guidance. Certain types of talk therapy also can help with negative thinking (cognitive behavioral therapy) and with relationship issues that trigger this disorder (interpersonal therapy). A mental health specialist can help determine which treatment is best for you. Most people with major depressive disorder do well with a combination of medicine and talk therapy. Treatments involving electrical stimulation of the brain can be used in situations with extremely severe symptoms or when medicine and talk therapy do not work over time. These treatments include electroconvulsive therapy, transcranial magnetic stimulation, and vagal nerve stimulation.   This information is not intended to replace advice given to you by your health care provider. Make sure you discuss any questions you have with your health care provider.   Document Released: 05/01/2012 Document Revised: 01/25/2014 Document Reviewed: 05/01/2012 Elsevier Interactive Patient Education [employer].

## 2015-03-01 NOTE — B[facility] Counselor (Signed)
TTS informed [facility] Nurse Manuela Schwartz, RN) and ED MD of pt discharge.

## 2015-03-01 NOTE — ED Notes (Signed)
Pt will be picked up by group home in approx 1 hr

## 2015-03-01 NOTE — BH Assessment (Signed)
Assessment Note  [name] is an 70 y.o. male who presents to the ER, due to having difficulty breathing and having AV/H and it was an ongoing thing with him.[name] He also reported vague SI, with no plan.  Patient is currently living in an [place], [facility]."  According to [facility] staff ([phone]), the patient came to the ER, due to having heart palpations. He further states, since he has known the patient, he hasn't had any SI, nor have he had AV/H. Staff believes he came to the ER due to, another resident came to the ER. They "hang out with each other" at the group home. "And what the other one do, the other does."  Patient reported to ER nursing staff, he was seeing dogs, while in the ER.   Patient denies SI/HI and AV/H with this Probation officer.  Diagnosis: Depression  Past Medical History:  Past Medical History  Diagnosis Date  . Atrial flutter ([place])     s/p ablation 08-2013  . Nonsustained ventricular tachycardia (HCC)     no clinical significance at present  . S/P hip replacement     remote  . Asthma   . Prostate cancer ([place])   . Depression   . Hypertension   . Diabetes mellitus without complication ([place])     TYPE 2   . Ulcerative colitis ([place])     HX OF   . COPD (chronic obstructive pulmonary disease) [facility])     Past Surgical History  Procedure Laterality Date  . Ileostomy    . Back surgery    . Hip surgery Right   . Tonsillectomy    . Ablation  09/07/2013    CTI ablation by Dr Caryl Comes  . Atrial flutter ablation N/A 09/07/2013    Procedure: ATRIAL FLUTTER ABLATION;  Surgeon: Deboraha Sprang, MD;  Location: [facility] CATH LAB;  Service: Cardiovascular;  Laterality: N/A;    Family History:  Family History  Problem Relation Age of Onset  . Heart attack Father     Social History:  reports that he quit smoking about 26 years ago. His smoking use included Cigarettes. He has a 30 pack-year smoking history. He has never used smokeless  tobacco. He reports that he does not drink alcohol or use illicit drugs.  Additional Social History:  Alcohol / Drug Use Pain Medications: See PTA Prescriptions: See PTA Over the Counter: See PTA History of alcohol / drug use?: No history of alcohol / drug abuse Longest period of sobriety (when/how long): Reports of having no drug or alcohol use Negative Consequences of Use:  (Reports of having no drug or alcohol use) Withdrawal Symptoms:  (Reports of having no drug or alcohol use)  CIWA: CIWA-Ar BP: 123/64 mmHg Pulse Rate: 61 COWS:    Allergies:  Allergies  Allergen Reactions  . Cefuroxime Rash  . Clarithromycin Rash  . Erythromycin Rash  . Isradipine Rash  . Morphine Itching and Other (See Comments)    Reaction:  Makes pt feel crazy   . Penicillins Rash and Other (See Comments)    Unable to obtain enough information to answer additional questions about this medication.    . Sulfonamide Derivatives Rash    Home Medications:  (Not in a hospital admission)  OB/GYN Status:  No LMP for male patient.  General Assessment Data Location of Assessment: [facility] ED TTS Assessment: In system Is this a Tele or Face-to-Face Assessment?: Face-to-Face Is this an Initial Assessment or a  Re-assessment for this encounter?: Initial Assessment Marital status: Single Maiden name: n/a Is patient pregnant?: No Pregnancy Status: No Living Arrangements: Other (Comment) ([place]) Can pt return to current living arrangement?: Yes Admission Status: Voluntary Is patient capable of signing voluntary admission?: Yes Referral Source: Self/Family/Friend Insurance type: Medicare  Medical Screening Exam ([place]) Medical Exam completed: Yes  Crisis Care Plan Living Arrangements: Other (Comment) (Assisted Living Facility) Legal Guardian: Other: (None Reported) Name of Psychiatrist: None Reported Name of Therapist: None Reported  Education Status Is patient currently in  school?: No Current Grade: n/a Highest grade of school patient has completed: GED Name of school: n/a Contact person: n/a  Risk to self with the past 6 months Suicidal Ideation: No-Not Currently/Within Last 6 Months Has patient been a risk to self within the past 6 months prior to admission? : No Suicidal Intent: No-Not Currently/Within Last 6 Months Has patient had any suicidal intent within the past 6 months prior to admission? : No Is patient at risk for suicide?: No Suicidal Plan?: No-Not Currently/Within Last 6 Months Has patient had any suicidal plan within the past 6 months prior to admission? : No Access to Means: No What has been your use of drugs/alcohol within the last 12 months?: None Reported Previous Attempts/Gestures: No How many times?: 0 Other Self Harm Risks: None Reported Triggers for Past Attempts: None known Intentional Self Injurious Behavior: None Family Suicide History: Yes (2nd Cousin, "In the 75s") Recent stressful life event(s): Other (Comment) Persecutory voices/beliefs?: No Depression: No Depression Symptoms: Feeling angry/irritable, Loss of interest in usual pleasures, Isolating, Tearfulness, Fatigue Substance abuse history and/or treatment for substance abuse?: No Suicide prevention information given to non-admitted patients: Not applicable  Risk to Others within the past 6 months Homicidal Ideation: No Does patient have any lifetime risk of violence toward others beyond the six months prior to admission? : No Thoughts of Harm to Others: No Current Homicidal Intent: No Current Homicidal Plan: No Access to Homicidal Means: No Identified Victim: None Reported History of harm to others?: No Assessment of Violence: None Noted Violent Behavior Description: None Reported Does patient have access to weapons?: No Criminal Charges Pending?: No Does patient have a court date: No Is patient on probation?: No  Psychosis Hallucinations: Auditory (When  started on Trazadone) Delusions: None noted  Mental Status Report Appearance/Hygiene: Unremarkable, In scrubs, In hospital gown Eye Contact: Good Motor Activity: Unable to assess (Patient laying in the bed) Speech: Logical/coherent Level of Consciousness: Alert Mood: Depressed, Anxious, Sad, Pleasant Affect: Appropriate to circumstance, Sad Anxiety Level: Minimal Thought Processes: Coherent, Relevant Judgement: Partial Orientation: Person, Place, Time, Situation, Appropriate for developmental age Obsessive Compulsive Thoughts/Behaviors: Minimal  Cognitive Functioning Concentration: Normal Memory: Recent Intact, Remote Intact IQ: Average Insight: Fair Impulse Control: Fair Appetite: Good Weight Loss: 0 Weight Gain: 0 Sleep: No Change Total Hours of Sleep: 6 Vegetative Symptoms: None  ADLScreening [facility] Assessment Services) Patient's cognitive ability adequate to safely complete daily activities?: Yes Patient able to express need for assistance with ADLs?: Yes Independently performs ADLs?: Yes (appropriate for developmental age)  Prior Inpatient Therapy Prior Inpatient Therapy: Yes Prior Therapy Dates: 2015 Prior Therapy Facilty/Provider(s): Patient don't remember the name of it. Reason for Treatment: Major Depression  Prior Outpatient Therapy Prior Outpatient Therapy: Yes Prior Therapy Dates: 11/2015 Prior Therapy Facilty/Provider(s): [facility] Reason for Treatment: Depression Does patient have an ACCT team?: No Does patient have Intensive In-House Services?  : No Does patient have [employer]  services? : No Does patient have P4CC services?: No  ADL Screening (condition at time of admission) Patient's cognitive ability adequate to safely complete daily activities?: Yes Is the patient deaf or have difficulty hearing?: No Does the patient have difficulty seeing, even when wearing glasses/contacts?: No Does the patient have difficulty concentrating,  remembering, or making decisions?: No Patient able to express need for assistance with ADLs?: Yes Does the patient have difficulty dressing or bathing?: No Independently performs ADLs?: Yes (appropriate for developmental age) Does the patient have difficulty walking or climbing stairs?: No Weakness of Legs: Both Weakness of Arms/Hands: None     Therapy Consults (therapy consults require a physician order) PT Evaluation Needed: No OT Evalulation Needed: No SLP Evaluation Needed: No Abuse/Neglect Assessment (Assessment to be complete while patient is alone) Physical Abuse: Denies Verbal Abuse: Denies Sexual Abuse: Denies Exploitation of patient/patient's resources: Denies Self-Neglect: Denies Values / Beliefs Cultural Requests During Hospitalization: None Spiritual Requests During Hospitalization: None Consults Spiritual Care Consult Needed: No Social Work Consult Needed: No Regulatory affairs officer (For Healthcare) Does patient have an advance directive?: Yes Type of Advance Directive: Living will    Additional Information 1:1 In Past 12 Months?: No CIRT Risk: No Elopement Risk: No Does patient have medical clearance?: Yes  Child/Adolescent Assessment Running Away Risk: Denies (Patient is an adult)  Disposition:  Disposition Initial Assessment Completed for this Encounter: Yes Disposition of Patient: Other dispositions (To be seen by Psych MD to see)  On Site Evaluation by:   Reviewed with Physician:     Gunnar Fusi, MS, LCAS, LPC, [place], CCSI 03/01/2015 4:28 AM

## 2015-03-01 NOTE — ED Notes (Signed)
Patient being dressed out in burgundy scrub set

## 2015-03-01 NOTE — ED Notes (Signed)
Pt currently denies any hallucinations, SI or HI.  Pt states now that the medication he was on wore off, he isn't seeing dogs. Pt has been up to use the bathroom and given a meal from dietary.

## 2015-03-01 NOTE — ED Provider Notes (Signed)
-----------------------------------------   6:40 AM on 03/01/2015 -----------------------------------------   Blood pressure 132/55, pulse 61, resp. rate 16, height 5' 9"  (1.753 m), weight 190 lb (86.183 kg), SpO2 97 %.  The patient had no acute events since last update.  Calm and cooperative at this time.  He remains voluntary; pending psychiatry evaluation today. Disposition is pending per Psychiatry/Behavioral Medicine team recommendations.     Paulette Blanch, MD 03/01/15 [phone]

## 2015-03-01 NOTE — Consult Note (Signed)
Tele-psych Assessment   Reason for Consult: Suicidal Ideation  Referring Physician: [facility] EDP  [name] is an 70 y.o. male who presented to the [facility] ED due to having difficult breathing and for hallucinations. Patient states "I get tired of having medical problems. I got an illeostomy in 1965 because of ulcerative colitis. I get bored in the group home. Nobody ever wants to play cards. Most of my family has passed away. I do have some friends and cousins that I stay in touch with. I do not want to hurt myself or anyone else. No I am not hearing or seeing anything. I just get so frustrated not being able to walk and having to deal with my medical problems. It just gets old."   HPI:   [name] is a 70 year old male who presented to the ED with physical complaints but also expressed vague suicidal thoughts and hallucinations. Patient reports having a history of depression for many years. The patient does not appear to be experiencing any psychotic process. Patient denies any type of auditory or visual hallucinations. He is fully alert and oriented. Patient admits getting frustrated over his health and living conditions but denies any intent to harm himself. His appearance is causal. His eye contact is good. His speech is clear and coherent. His affect is mildly depressed. His thought content concerns his frustration over having chronic medical problems. His memory both remote and recent appear intact. His judgment is fair and insight is present. His psychomotor activity is normal. His concentration is intact. Patient appears to have good recall. His language is good and cognition is within normal limits. His affect considerably brightens when talking about hobbies and about some friends he has. Patient expresses a desire to follow up outpatient for medication management and for possible psychotherapy to help him better cope with his  chronic stressors. The patient was very pleasant during assessment and reported feeling stable to return to his group home. Patient reports that his general level of depression is around a five out of ten scale. The patient did not appear to be experiencing any current physical complaints such as shortness of breath or chest pain. His current vital signs are within normal limits. Patient's drug screen is negative for any substances of abuse. He appears stable for discharge back to his group home. From his current medication list it appears that the patient is receiving Effexor at the group home. However, patient was not a good historian regarding his current psychiatric follow up.   Past Medical History  Diagnosis Date  . Atrial flutter ([place])     s/p ablation 08-2013  . Nonsustained ventricular tachycardia (HCC)     no clinical significance at present  . S/P hip replacement     remote  . Asthma   . Prostate cancer ([place])   . Depression   . Hypertension   . Diabetes mellitus without complication ([place])     TYPE 2   . Ulcerative colitis ([place])     HX OF   . COPD (chronic obstructive pulmonary disease) [facility])     Past Surgical History  Procedure Laterality Date  . Ileostomy    .  Back surgery    . Hip surgery Right   . Tonsillectomy    . Ablation  09/07/2013    CTI ablation by Dr Caryl Comes  . Atrial flutter ablation N/A 09/07/2013    Procedure: ATRIAL FLUTTER ABLATION;  Surgeon: Deboraha Sprang, MD;  Location: [facility] CATH LAB;  Service: Cardiovascular;  Laterality: N/A;    Family History  Problem Relation Age of Onset  . Heart attack Father     Social History:  reports that he quit smoking about 26 years ago. His smoking use included Cigarettes. He has a 30 pack-year smoking history. He has never used smokeless tobacco. He reports that he does not drink alcohol or use illicit drugs.  Allergies:  Allergies  Allergen Reactions  . Cefuroxime Rash  . Clarithromycin Rash  . Erythromycin Rash  .  Isradipine Rash  . Morphine Itching and Other (See Comments)    Reaction:  Makes pt feel crazy   . Penicillins Rash and Other (See Comments)    Unable to obtain enough information to answer additional questions about this medication.    . Sulfonamide Derivatives Rash    Medications: I have reviewed the patient's current medications.  Results for orders placed or performed during the hospital encounter of 02/28/15 (from the past 48 hour(s))  CBC with Differential     Status: Abnormal   Collection Time: 02/28/15  5:24 PM  Result Value Ref Range   WBC 11.3 (H) 3.8 - 10.6 K/uL   RBC 4.61 4.40 - 5.90 MIL/uL   Hemoglobin 13.1 13.0 - 18.0 g/dL   HCT 39.8 (L) 40.0 - 52.0 %   MCV 86.3 80.0 - 100.0 fL   MCH 28.4 26.0 - 34.0 pg   MCHC 32.9 32.0 - 36.0 g/dL   RDW 13.7 11.5 - 14.5 %   Platelets 201 150 - 440 K/uL   Neutrophils Relative % 70 %   Neutro Abs 7.9 (H) 1.4 - 6.5 K/uL   Lymphocytes Relative 19 %   Lymphs Abs 2.1 1.0 - 3.6 K/uL   Monocytes Relative 8 %   Monocytes Absolute 0.9 0.2 - 1.0 K/uL   Eosinophils Relative 2 %   Eosinophils Absolute 0.3 0 - 0.7 K/uL   Basophils Relative 1 %   Basophils Absolute 0.1 0 - 0.1 K/uL  Basic metabolic panel     Status: Abnormal   Collection Time: 02/28/15  5:24 PM  Result Value Ref Range   Sodium 136 135 - 145 mmol/L   Potassium 3.9 3.5 - 5.1 mmol/L   Chloride 102 101 - 111 mmol/L   CO2 27 22 - 32 mmol/L   Glucose, Bld 124 (H) 65 - 99 mg/dL   BUN 20 6 - 20 mg/dL   Creatinine, Ser 0.83 0.61 - 1.24 mg/dL   Calcium 9.2 8.9 - 10.3 mg/dL   GFR calc non Af Amer >60 >60 mL/min   GFR calc Af Amer >60 >60 mL/min    Comment: (NOTE) The eGFR has been calculated using the CKD EPI equation. This calculation has not been validated in all clinical situations. eGFR's persistently <60 mL/min signify possible Chronic Kidney Disease.    Anion gap 7 5 - 15  Brain natriuretic peptide     Status: None   Collection Time: 02/28/15  5:24 PM  Result Value  Ref Range   B Natriuretic Peptide 21.0 0.0 - 100.0 pg/mL  Troponin I     Status: None   Collection Time: 02/28/15  5:24 PM  Result Value Ref Range   Troponin I 0.03 <0.031 ng/mL    Comment:        NO INDICATION OF MYOCARDIAL INJURY.   Blood gas, venous     Status: Abnormal   Collection Time: 02/28/15  5:24 PM  Result Value Ref Range   FIO2 21.00    pH, Ven 7.43 7.320 - 7.430   pCO2, Ven 42 (L) 44.0 - 60.0 mmHg   pO2, Ven <31.0 30.0 - 45.0 mmHg   Bicarbonate 27.9 21.0 - 28.0 mEq/L   Acid-Base Excess 3.2 (H) 0.0 - 3.0 mmol/L   Patient temperature 37.0    Collection site VEIN    Sample type VEIN   Urinalysis complete, with microscopic     Status: Abnormal   Collection Time: 02/28/15  5:24 PM  Result Value Ref Range   Color, Urine YELLOW (A) YELLOW   APPearance CLEAR (A) CLEAR   Glucose, UA 50 (A) NEGATIVE mg/dL   Bilirubin Urine NEGATIVE NEGATIVE   Ketones, ur TRACE (A) NEGATIVE mg/dL   Specific Gravity, Urine 1.023 1.005 - 1.030   Hgb urine dipstick NEGATIVE NEGATIVE   pH 5.0 5.0 - 8.0   Protein, ur NEGATIVE NEGATIVE mg/dL   Nitrite NEGATIVE NEGATIVE   Leukocytes, UA TRACE (A) NEGATIVE   RBC / HPF 0-5 0 - 5 RBC/hpf   WBC, UA 6-30 0 - 5 WBC/hpf   Bacteria, UA NONE SEEN NONE SEEN   Squamous Epithelial / LPF 0-5 (A) NONE SEEN   Mucous PRESENT    Hyaline Casts, UA PRESENT   Ammonia     Status: None   Collection Time: 02/28/15  5:24 PM  Result Value Ref Range   Ammonia 9 9 - 35 umol/L  Hepatic function panel     Status: Abnormal   Collection Time: 02/28/15  5:24 PM  Result Value Ref Range   Total Protein 6.8 6.5 - 8.1 g/dL   Albumin 3.9 3.5 - 5.0 g/dL   AST 18 15 - 41 U/L   ALT 14 (L) 17 - 63 U/L   Alkaline Phosphatase 61 38 - 126 U/L   Total Bilirubin 0.5 0.3 - 1.2 mg/dL   Bilirubin, Direct <0.1 (L) 0.1 - 0.5 mg/dL   Indirect Bilirubin NOT CALCULATED 0.3 - 0.9 mg/dL  Salicylate level     Status: None   Collection Time: 02/28/15  5:24 PM  Result Value Ref Range    Salicylate Lvl <7.5 2.8 - 30.0 mg/dL  Acetaminophen level     Status: Abnormal   Collection Time: 02/28/15  5:24 PM  Result Value Ref Range   Acetaminophen (Tylenol), Serum <10 (L) 10 - 30 ug/mL    Comment:        THERAPEUTIC CONCENTRATIONS VARY SIGNIFICANTLY. A RANGE OF 10-30 ug/mL MAY BE AN EFFECTIVE CONCENTRATION FOR MANY PATIENTS. HOWEVER, SOME ARE BEST TREATED AT CONCENTRATIONS OUTSIDE THIS RANGE. ACETAMINOPHEN CONCENTRATIONS >150 ug/mL AT 4 HOURS AFTER INGESTION AND >50 ug/mL AT 12 HOURS AFTER INGESTION ARE OFTEN ASSOCIATED WITH TOXIC REACTIONS.   Urine Drug Screen, Qualitative     Status: None   Collection Time: 02/28/15  5:24 PM  Result Value Ref Range   Tricyclic, Ur Screen NONE DETECTED NONE DETECTED   Amphetamines, Ur Screen NONE DETECTED NONE DETECTED   MDMA (Ecstasy)Ur Screen NONE DETECTED NONE DETECTED   Cocaine Metabolite,Ur [place] NONE DETECTED NONE DETECTED   Opiate, Ur Screen NONE DETECTED NONE DETECTED   Phencyclidine (PCP) Ur S NONE DETECTED NONE DETECTED  Cannabinoid 50 Ng, Ur [place] NONE DETECTED NONE DETECTED   Barbiturates, Ur Screen NONE DETECTED NONE DETECTED   Benzodiazepine, Ur Scrn NONE DETECTED NONE DETECTED   Methadone Scn, Ur NONE DETECTED NONE DETECTED    Comment: (NOTE) 956  Tricyclics, urine               Cutoff 1000 ng/mL 200  Amphetamines, urine             Cutoff 1000 ng/mL 300  MDMA (Ecstasy), urine           Cutoff 500 ng/mL 400  Cocaine Metabolite, urine       Cutoff 300 ng/mL 500  Opiate, urine                   Cutoff 300 ng/mL 600  Phencyclidine (PCP), urine      Cutoff 25 ng/mL 700  Cannabinoid, urine              Cutoff 50 ng/mL 800  Barbiturates, urine             Cutoff 200 ng/mL 900  Benzodiazepine, urine           Cutoff 200 ng/mL 1000 Methadone, urine                Cutoff 300 ng/mL 1100 1200 The urine drug screen provides only a preliminary, unconfirmed 1300 analytical test result and should not be used for non-medical 1400  purposes. Clinical consideration and professional judgment should 1500 be applied to any positive drug screen result due to possible 1600 interfering substances. A more specific alternate chemical method 1700 must be used in order to obtain a confirmed analytical result.  1800 Gas chromato graphy / mass spectrometry (GC/MS) is the preferred 1900 confirmatory method.   Urine culture     Status: None (Preliminary result)   Collection Time: 02/28/15  5:24 PM  Result Value Ref Range   Specimen Description URINE, CLEAN CATCH    Special Requests NONE    Culture NO GROWTH < 12 HOURS    Report Status PENDING   Protime-INR     Status: None   Collection Time: 02/28/15  6:51 PM  Result Value Ref Range   Prothrombin Time 14.7 11.4 - 15.0 seconds   INR 1.13   Troponin I     Status: None   Collection Time: 02/28/15  9:45 PM  Result Value Ref Range   Troponin I 0.03 <0.031 ng/mL    Comment:        NO INDICATION OF MYOCARDIAL INJURY.     Dg Chest 2 View  02/28/2015  CLINICAL DATA:  Shortness of breath. History of bronchitis, COPD, hypertension, and diabetes. EXAM: CHEST  2 VIEW COMPARISON:  02/28/2014 FINDINGS: Mild central airway thickening. The lungs appear otherwise clear. Cardiac and mediastinal margins appear normal. Thoracic spondylosis noted. Minimal flattening of the hemidiaphragms on the lateral projection. IMPRESSION: 1. Airway thickening is present, suggesting bronchitis or reactive airways disease. 2. Thoracic spondylosis. Electronically Signed   By: Van Clines M.D.   On: 02/28/2015 16:24    Review of Systems  Psychiatric/Behavioral: Positive for depression. Negative for suicidal ideas, hallucinations, memory loss and substance abuse. The patient is nervous/anxious. The patient does not have insomnia.    Blood pressure 121/65, pulse 75, resp. rate 20, height 5' 9"  (1.753 m), weight 86.183 kg (190 lb), SpO2 95 %. Physical Exam  Assessment/Plan: Major Depressive Disorder,  Recurrent Moderate  Patient does not appear  to be a danger to himself or others at this time. He does not meet criteria for an inpatient psychiatric admission. The patient is stable to return to his group home with outpatient follow up referrals provided by ER staff.    Elmarie Shiley, NP-C 03/01/2015, 4:43 PM

## 2015-03-02 LAB — URINE CULTURE: CULTURE: NO GROWTH

## 2016-04-03 ENCOUNTER — Encounter: Payer: Self-pay | Admitting: Emergency Medicine

## 2016-04-03 ENCOUNTER — Emergency Department
Admission: EM | Admit: 2016-04-03 | Discharge: 2016-04-03 | Disposition: A | Discharge status: Home / Self Care | Payer: Medicare HMO | Attending: Emergency Medicine | Admitting: Emergency Medicine

## 2016-04-03 DIAGNOSIS — Z8546 Personal history of malignant neoplasm of prostate: Secondary | ICD-10-CM | POA: Diagnosis not present

## 2016-04-03 DIAGNOSIS — J45909 Unspecified asthma, uncomplicated: Secondary | ICD-10-CM | POA: Diagnosis not present

## 2016-04-03 DIAGNOSIS — R350 Frequency of micturition: Secondary | ICD-10-CM | POA: Diagnosis not present

## 2016-04-03 DIAGNOSIS — Z87891 Personal history of nicotine dependence: Secondary | ICD-10-CM | POA: Insufficient documentation

## 2016-04-03 DIAGNOSIS — J449 Chronic obstructive pulmonary disease, unspecified: Secondary | ICD-10-CM | POA: Diagnosis not present

## 2016-04-03 DIAGNOSIS — I10 Essential (primary) hypertension: Secondary | ICD-10-CM | POA: Diagnosis not present

## 2016-04-03 DIAGNOSIS — E119 Type 2 diabetes mellitus without complications: Secondary | ICD-10-CM | POA: Insufficient documentation

## 2016-04-03 DIAGNOSIS — Z7984 Long term (current) use of oral hypoglycemic drugs: Secondary | ICD-10-CM | POA: Insufficient documentation

## 2016-04-03 DIAGNOSIS — R300 Dysuria: Secondary | ICD-10-CM | POA: Diagnosis present

## 2016-04-03 LAB — GLUCOSE, CAPILLARY: Glucose-Capillary: 107 mg/dL — ABNORMAL HIGH (ref 65–99)

## 2016-04-03 LAB — URINALYSIS, COMPLETE (UACMP) WITH MICROSCOPIC
Bacteria, UA: NONE SEEN
Bilirubin Urine: NEGATIVE
GLUCOSE, UA: 50 mg/dL — AB
Hgb urine dipstick: NEGATIVE
Ketones, ur: NEGATIVE mg/dL
LEUKOCYTES UA: NEGATIVE
Nitrite: NEGATIVE
PH: 5 (ref 5.0–8.0)
Protein, ur: NEGATIVE mg/dL
RBC / HPF: NONE SEEN RBC/hpf (ref 0–5)
SPECIFIC GRAVITY, URINE: 1.023 (ref 1.005–1.030)
SQUAMOUS EPITHELIAL / LPF: NONE SEEN

## 2016-04-03 NOTE — ED Notes (Signed)

## 2016-04-03 NOTE — ED Triage Notes (Signed)
Pt having dysuria for "a few weeks" sent in by [facility]. Is to be sent back by [employer].

## 2016-04-03 NOTE — ED Notes (Signed)
Pt states unable to urinate at this time. Given water to drink

## 2016-04-03 NOTE — ED Provider Notes (Signed)
[facility] Emergency Department Provider Note   ____________________________________________   First MD Initiated Contact with Patient 04/03/16 1453     (approximate)  I have reviewed the triage vital signs and the nursing notes.   HISTORY  Chief Complaint Dysuria    HPI [name] is a 71 y.o. male patient complaining of urinary frequency for several weeks. Patient was [facility] to be evaluated for complaint of dysuria.. Patient adamantly denies blood in his urine. Patient states urinary frequency cause him to go to wake up repeatedly during the night to go to bathroom. Patient is a very poor historian when trying to get timeframes onset his complaint. Patient is denying pain at this time. Patient of prostate cancer results and in his prostate being excised. Patient also received    Past Medical History:  Diagnosis Date  . Asthma   . Atrial flutter ([place])    s/p ablation 08-2013  . COPD (chronic obstructive pulmonary disease) ([place])   . Depression   . Diabetes mellitus without complication ([place])    TYPE 2   . Hypertension   . Nonsustained ventricular tachycardia (HCC)    no clinical significance at present  . Prostate cancer ([place])   . S/P hip replacement    remote  . Ulcerative colitis ([place])    HX OF     Patient Active Problem List   Diagnosis Date Noted  . MDD (major depressive disorder), recurrent episode, moderate ([place]) 03/01/2015  . Iatrogenic hypotension 02/20/2013  . Ventricular tachycardia-nonsustained 02/20/2013  . BRONCHITIS NOT SPECIFIED AS ACUTE OR CHRONIC 10/03/2009  . DYSLIPIDEMIA 09/30/2008  . ESSENTIAL HYPERTENSION, BENIGN 09/30/2008  . Atrial flutter ([place]) 09/30/2008  . PALPITATIONS 09/30/2008    Past Surgical History:  Procedure Laterality Date  . ABLATION  09/07/2013   CTI ablation by Dr Caryl Comes  . ATRIAL FLUTTER ABLATION N/A 09/07/2013   Procedure: ATRIAL FLUTTER ABLATION;  Surgeon: Deboraha Sprang, MD;  Location: [facility] CATH LAB;  Service: Cardiovascular;  Laterality: N/A;  . BACK SURGERY    . HIP SURGERY Right   . ILEOSTOMY    . TONSILLECTOMY      Prior to Admission medications   Medication Sig Start Date End Date Taking? Authorizing Provider  atorvastatin (LIPITOR) 10 MG tablet Take 10 mg by mouth at bedtime.    Historical Provider, MD  Calcium Carbonate-Vitamin D (CALCIUM 600+D) 600-400 MG-UNIT tablet Take 1 tablet by mouth 2 (two) times daily with a meal.    Historical Provider, MD  cetirizine (ZYRTEC) 10 MG tablet Take 10 mg by mouth daily.    Historical Provider, MD  diltiazem (CARDIZEM CD) 120 MG 24 hr capsule Take 1 capsule (120 mg total) by mouth daily. 04/24/14   Deboraha Sprang, MD  metFORMIN (GLUCOPHAGE) 500 MG tablet Take 500 mg by mouth 2 (two) times daily with a meal.     Historical Provider, MD  mometasone (ASMANEX) 220 MCG/INH inhaler Inhale 2 puffs into the lungs 2 (two) times daily.     Historical Provider, MD  traZODone (DESYREL) 100 MG tablet Take 100 mg by mouth at bedtime.    Historical Provider, MD  TRELSTAR MIXJECT 22.5 MG injection Inject 22.5 mg into the muscle every 6 (six) months.     Historical Provider, MD  venlafaxine XR (EFFEXOR-XR) 150 MG 24 hr capsule Take 150 mg by mouth at bedtime.    Historical Provider, MD    Allergies Cefuroxime; Clarithromycin; Erythromycin;  Isradipine; Morphine; Penicillins; and Sulfonamide derivatives  Family History  Problem Relation Age of Onset  . Heart attack Father     Social History Social History  Substance Use Topics  . Smoking status: Former Smoker    Packs/day: 3.00    Years: 10.00    Types: Cigarettes    Quit date: 12/03/1988  . Smokeless tobacco: Never Used  . Alcohol use No    Review of Systems Constitutional: No fever/chills Eyes: No visual changes. ENT: No sore throat. Cardiovascular: Denies chest pain. Respiratory: Denies shortness of breath. Gastrointestinal: No abdominal pain.  No nausea,  no vomiting.  No diarrhea.  No constipation. Genitourinary: Positive urinary frequency. History of prostate cancer. Musculoskeletal: Negative for back pain. Skin: Negative for rash. Neurological: Negative for headaches, focal weakness or numbness. Endocrine:Hypertension and diabetes. Allergic/Immunilogical: See medication list  ____________________________________________   PHYSICAL EXAM:  VITAL SIGNS: ED Triage Vitals [04/03/16 1340]  Enc Vitals Group     BP (!) 141/64     Pulse Rate 81     Resp 18     Temp 98 F (36.7 C)     Temp src      SpO2 97 %     Weight 180 lb (81.6 kg)     Height 5' 9"  (1.753 m)     Head Circumference      Peak Flow      Pain Score      Pain Loc      Pain Edu?      Excl. in [place]?     Constitutional: Alert and oriented. Well appearing and in no acute distress. Eyes: Conjunctivae are normal. PERRL. EOMI. Head: Atraumatic. Nose: No congestion/rhinnorhea. Mouth/Throat: Mucous membranes are moist.  Oropharynx non-erythematous. Neck: No stridor.  No cervical spine tenderness to palpation. Hematological/Lymphatic/Immunilogical: No cervical lymphadenopathy. Cardiovascular: Normal rate, regular rhythm. Grossly normal heart sounds.  Good peripheral circulation. Respiratory: Normal respiratory effort.  No retractions. Lungs CTAB. Gastrointestinal: Soft and nontender. No distention. No abdominal bruits. No CVA tenderness. Musculoskeletal: No lower extremity tenderness nor edema.  No joint effusions. Neurologic:  Normal speech and language. No gross focal neurologic deficits are appreciated. No gait instability. Skin:  Skin is warm, dry and intact. No rash noted. Psychiatric: Mood and affect are normal. Speech and behavior are normal.  ____________________________________________   LABS (all labs ordered are listed, but only abnormal results are displayed)  Labs Reviewed  URINALYSIS, COMPLETE (UACMP) WITH MICROSCOPIC - Abnormal; Notable for the  following:       Result Value   Color, Urine YELLOW (*)    APPearance CLEAR (*)    Glucose, UA 50 (*)    All other components within normal limits  GLUCOSE, CAPILLARY - Abnormal; Notable for the following:    Glucose-Capillary 107 (*)    All other components within normal limits  CBG MONITORING, ED   ____________________________________________  EKG   ____________________________________________  RADIOLOGY   ____________________________________________   PROCEDURES  Procedure(s) performed: None  Procedures  Critical Care performed: No  ____________________________________________   INITIAL IMPRESSION / ASSESSMENT AND PLAN / ED COURSE  Pertinent labs & imaging results that were available during my care of the patient were reviewed by me and considered in my medical decision making (see chart for details).  Urinary frequency. Patient given discharge care instructions. Patient advised to follow with urology for definitive evaluation and treatment.      ____________________________________________   FINAL CLINICAL IMPRESSION(S) / ED DIAGNOSES  Final diagnoses:  Urinary  frequency      NEW MEDICATIONS STARTED DURING THIS VISIT:  New Prescriptions   No medications on file     Note:  This document was prepared using Dragon voice recognition software and may include unintentional dictation errors.    Sable Feil, PA-C 04/03/16 1606    Orbie Pyo, MD 04/04/16 [phone]

## 2016-05-21 NOTE — Progress Notes (Signed)
This encounter was created in error - please disregard.

## 2016-05-24 ENCOUNTER — Encounter: Payer: Medicare HMO | Admitting: Urology

## 2016-08-14 ENCOUNTER — Emergency Department
Admission: EM | Admit: 2016-08-14 | Discharge: 2016-08-15 | Disposition: A | Discharge status: Home / Self Care | Payer: Medicare HMO | Attending: Emergency Medicine | Admitting: Emergency Medicine

## 2016-08-14 DIAGNOSIS — I10 Essential (primary) hypertension: Secondary | ICD-10-CM | POA: Insufficient documentation

## 2016-08-14 DIAGNOSIS — J45909 Unspecified asthma, uncomplicated: Secondary | ICD-10-CM | POA: Insufficient documentation

## 2016-08-14 DIAGNOSIS — E119 Type 2 diabetes mellitus without complications: Secondary | ICD-10-CM | POA: Insufficient documentation

## 2016-08-14 DIAGNOSIS — R451 Restlessness and agitation: Secondary | ICD-10-CM | POA: Diagnosis not present

## 2016-08-14 DIAGNOSIS — Z87891 Personal history of nicotine dependence: Secondary | ICD-10-CM | POA: Diagnosis not present

## 2016-08-14 DIAGNOSIS — J449 Chronic obstructive pulmonary disease, unspecified: Secondary | ICD-10-CM | POA: Diagnosis not present

## 2016-08-14 DIAGNOSIS — R4182 Altered mental status, unspecified: Secondary | ICD-10-CM | POA: Diagnosis present

## 2016-08-14 DIAGNOSIS — Z139 Encounter for screening, unspecified: Secondary | ICD-10-CM

## 2016-08-14 LAB — URINALYSIS, COMPLETE (UACMP) WITH MICROSCOPIC
BILIRUBIN URINE: NEGATIVE
Glucose, UA: 50 mg/dL — AB
Hgb urine dipstick: NEGATIVE
KETONES UR: NEGATIVE mg/dL
LEUKOCYTES UA: NEGATIVE
NITRITE: NEGATIVE
PROTEIN: NEGATIVE mg/dL
SQUAMOUS EPITHELIAL / LPF: NONE SEEN
Specific Gravity, Urine: 1.002 — ABNORMAL LOW (ref 1.005–1.030)
pH: 6 (ref 5.0–8.0)

## 2016-08-14 LAB — CBC WITH DIFFERENTIAL/PLATELET
BASOS ABS: 0.1 10*3/uL (ref 0–0.1)
BASOS PCT: 1 %
EOS ABS: 0.2 10*3/uL (ref 0–0.7)
Eosinophils Relative: 3 %
HEMATOCRIT: 37.1 % — AB (ref 40.0–52.0)
Hemoglobin: 12.6 g/dL — ABNORMAL LOW (ref 13.0–18.0)
Lymphocytes Relative: 24 %
Lymphs Abs: 1.9 10*3/uL (ref 1.0–3.6)
MCH: 28.9 pg (ref 26.0–34.0)
MCHC: 34 g/dL (ref 32.0–36.0)
MCV: 85.1 fL (ref 80.0–100.0)
MONO ABS: 0.8 10*3/uL (ref 0.2–1.0)
Monocytes Relative: 11 %
NEUTROS ABS: 4.7 10*3/uL (ref 1.4–6.5)
NEUTROS PCT: 61 %
PLATELETS: 210 10*3/uL (ref 150–440)
RBC: 4.36 MIL/uL — ABNORMAL LOW (ref 4.40–5.90)
RDW: 13.6 % (ref 11.5–14.5)
WBC: 7.7 10*3/uL (ref 3.8–10.6)

## 2016-08-14 LAB — COMPREHENSIVE METABOLIC PANEL
ALBUMIN: 3.5 g/dL (ref 3.5–5.0)
ALT: 13 U/L — ABNORMAL LOW (ref 17–63)
ANION GAP: 8 (ref 5–15)
AST: 20 U/L (ref 15–41)
Alkaline Phosphatase: 62 U/L (ref 38–126)
BILIRUBIN TOTAL: 0.4 mg/dL (ref 0.3–1.2)
BUN: 20 mg/dL (ref 6–20)
CHLORIDE: 104 mmol/L (ref 101–111)
CO2: 26 mmol/L (ref 22–32)
Calcium: 9.1 mg/dL (ref 8.9–10.3)
Creatinine, Ser: 0.97 mg/dL (ref 0.61–1.24)
GFR calc Af Amer: >60 mL/min (ref >60)
GFR calc non Af Amer: >60 mL/min (ref >60)
GLUCOSE: 190 mg/dL — AB (ref 65–99)
POTASSIUM: 3.4 mmol/L — AB (ref 3.5–5.1)
SODIUM: 138 mmol/L (ref 135–145)
Total Protein: 6.6 g/dL (ref 6.5–8.1)

## 2016-08-14 LAB — URINE DRUG SCREEN, QUALITATIVE (ARMC ONLY)
Amphetamines, Ur Screen: NOT DETECTED
BARBITURATES, UR SCREEN: NOT DETECTED
BENZODIAZEPINE, UR SCRN: NOT DETECTED
Cannabinoid 50 Ng, Ur ~~LOC~~: NOT DETECTED
Cocaine Metabolite,Ur ~~LOC~~: NOT DETECTED
MDMA (Ecstasy)Ur Screen: NOT DETECTED
METHADONE SCREEN, URINE: NOT DETECTED
OPIATE, UR SCREEN: NOT DETECTED
Phencyclidine (PCP) Ur S: NOT DETECTED
TRICYCLIC, UR SCREEN: NOT DETECTED

## 2016-08-14 LAB — TROPONIN I: Troponin I: <0.03 ng/mL (ref <0.03)

## 2016-08-14 MED ORDER — DIAZEPAM 5 MG PO TABS
5.0000 mg | ORAL_TABLET | Freq: Once | ORAL | Status: AC
  Administered 2016-08-14: 5 mg via ORAL
  Filled 2016-08-14: qty 1

## 2016-08-14 NOTE — ED Provider Notes (Signed)
[facility] Emergency Department Provider Note       Time seen: ----------------------------------------- 7:07 PM on 08/14/2016 -----------------------------------------     I have reviewed the triage vital signs and the nursing notes.   HISTORY   Chief Complaint No chief complaint on file.    HPI [name] is a 71 y.o. male who presents to the ED for intermittent agitation with a request from his care facility that he receive a psychiatric evaluation. Patient states he gets angry from time to time because he sits in the room all day and doesn't really do anything. Patient states she has not wanted to hurt himself or anyone else. He does get angry. He denies any recent physical confrontation. He denies any recent illness or other complaints.   Past Medical History:  Diagnosis Date  . Asthma   . Atrial flutter ([place])    s/p ablation 08-2013  . COPD (chronic obstructive pulmonary disease) ([place])   . Depression   . Diabetes mellitus without complication ([place])    TYPE 2   . Hypertension   . Nonsustained ventricular tachycardia (HCC)    no clinical significance at present  . Prostate cancer ([place])   . S/P hip replacement    remote  . Ulcerative colitis ([place])    HX OF     Patient Active Problem List   Diagnosis Date Noted  . MDD (major depressive disorder), recurrent episode, moderate ([place]) 03/01/2015  . Iatrogenic hypotension 02/20/2013  . Ventricular tachycardia-nonsustained 02/20/2013  . BRONCHITIS NOT SPECIFIED AS ACUTE OR CHRONIC 10/03/2009  . DYSLIPIDEMIA 09/30/2008  . ESSENTIAL HYPERTENSION, BENIGN 09/30/2008  . Atrial flutter ([place]) 09/30/2008  . PALPITATIONS 09/30/2008    Past Surgical History:  Procedure Laterality Date  . ABLATION  09/07/2013   CTI ablation by Dr Caryl Comes  . ATRIAL FLUTTER ABLATION N/A 09/07/2013   Procedure: ATRIAL FLUTTER ABLATION;  Surgeon: Deboraha Sprang, MD;  Location: [facility] CATH LAB;  Service: Cardiovascular;   Laterality: N/A;  . BACK SURGERY    . HIP SURGERY Right   . ILEOSTOMY    . TONSILLECTOMY      Allergies Cefuroxime; Clarithromycin; Erythromycin; Isradipine; Morphine; Penicillins; and Sulfonamide derivatives  Social History Social History  Substance Use Topics  . Smoking status: Former Smoker    Packs/day: 3.00    Years: 10.00    Types: Cigarettes    Quit date: 12/03/1988  . Smokeless tobacco: Never Used  . Alcohol use No    Review of Systems Constitutional: Negative for fever. Cardiovascular: Negative for chest pain. Respiratory: Negative for shortness of breath. Gastrointestinal: Negative for abdominal pain, vomiting and diarrhea. Genitourinary: Negative for dysuria. Musculoskeletal: Negative for back pain. Skin: Negative for rash. Neurological: Negative for headaches, focal weakness or numbness.  All systems negative/normal/unremarkable except as stated in the HPI  ____________________________________________   PHYSICAL EXAM:  VITAL SIGNS: ED Triage Vitals  Enc Vitals Group     BP      Pulse      Resp      Temp      Temp src      SpO2      Weight      Height      Head Circumference      Peak Flow      Pain Score      Pain Loc      Pain Edu?      Excl. in [place]?     Constitutional: Alert and oriented.  Well appearing and in no distress. Eyes: Conjunctivae are normal. Normal extraocular movements. ENT   Head: Normocephalic and atraumatic.   Nose: No congestion/rhinnorhea.   Mouth/Throat: Mucous membranes are moist.   Neck: No stridor. Cardiovascular: Normal rate, regular rhythm. No murmurs, rubs, or gallops. Respiratory: Normal respiratory effort without tachypnea nor retractions. Breath sounds are clear and equal bilaterally. No wheezes/rales/rhonchi. Gastrointestinal: Soft and nontender. Normal bowel sounds Musculoskeletal: Nontender with normal range of motion in extremities. No lower extremity tenderness nor edema. Neurologic:  Normal  speech and language. No gross focal neurologic deficits are appreciated.  Skin:  Skin is warm, dry and intact. No rash noted. Psychiatric: Mood and affect are normal. Speech and behavior are normal.  ____________________________________________  ED COURSE:  Pertinent labs & imaging results that were available during my care of the patient were reviewed by me and considered in my medical decision making (see chart for details). Patient presents for A psychiatric evaluation, we will assess with labs as indicated.   Procedures ____________________________________________   LABS (pertinent positives/negatives)  Labs Reviewed  CBC WITH DIFFERENTIAL/PLATELET - Abnormal; Notable for the following:       Result Value   RBC 4.36 (*)    Hemoglobin 12.6 (*)    HCT 37.1 (*)    All other components within normal limits  COMPREHENSIVE METABOLIC PANEL - Abnormal; Notable for the following:    Potassium 3.4 (*)    Glucose, Bld 190 (*)    ALT 13 (*)    All other components within normal limits  TROPONIN I  URINALYSIS, COMPLETE (UACMP) WITH MICROSCOPIC  URINE DRUG SCREEN, QUALITATIVE ([facility] ONLY)   ___________________________________________  FINAL ASSESSMENT AND PLAN  Medical screening exam, agitation  Plan: Patient's labs were dictated above. Patient had presented for agitation but otherwise appears medically and psychiatrically stable for outpatient follow-up. He is not felt to be a threat to himself or others at this time.   Earleen Newport, MD   Note: This note was generated in part or whole with voice recognition software. Voice recognition is usually quite accurate but there are transcription errors that can and very often do occur. I apologize for any typographical errors that were not detected and corrected.     Earleen Newport, MD 08/14/16 2242

## 2016-08-14 NOTE — ED Notes (Signed)
Pt was provided with a urinal in order for [place] to obtain a urine sample

## 2016-08-14 NOTE — ED Notes (Signed)
Pt was given a snack and beverage for his midnight meal. Pt not in need of any thing at this time, Pt is partially incontinent. Pt has a brief on at this time,. Pt has been changed twice and has urinated in urinal 2 times. Pt is able to stand with 1x assist. This EDT has changed pts illiostomy 1x

## 2016-08-15 NOTE — ED Notes (Signed)
Caregiver, Silva Bandy, [phone], from [facility], called to check on pt, POC updated, caller requested psych assessment for pt d/t hx of recent anger outbursts and requests PRN medicine for agitation, explained that these services weren't readily available at night, caller reports "we'll come get him him when he's ready" call cut short for [facility] emergency

## 2016-08-15 NOTE — ED Notes (Signed)
SOC machine to bedside

## 2016-08-15 NOTE — ED Notes (Signed)
SOC in progress.

## 2016-08-15 NOTE — ED Notes (Signed)
Report from noel, rn. Pt sleeping, has personal belongings at bedside, report from previous rn stated md williams did not want pt dressed out or belongings removed.

## 2016-08-15 NOTE — ED Provider Notes (Signed)
-----------------------------------------   4:45 AM on 08/15/2016 -----------------------------------------   Blood pressure (!) 120/53, pulse 67, temperature 98.3 F (36.8 C), temperature source Oral, resp. rate 16, height 5' 10"  (1.778 m), weight 81.6 kg (180 lb), SpO2 95 %.  Assuming care from Dr. Jimmye Norman.  In short, [name] is a 71 y.o. male with a chief complaint of Altered Mental Status .  Refer to the original H&P for additional details.  The current plan of care is to follow up the results of the specialist on-call.     Cording to the specialist on-call the patient has this intermittent agitation very regularly. They do not feel that there is an acute problem but that this patient needs to follow-up with his regular psychiatrist to have a behavioral care plan in place whenever he has these situations. He does not feel that the patient needs to be kept in the emergency department or have any other acute intervention. He recommends discharge the patient and having his psychiatrist adjust his medications and developed a behavioral plan for him. The patient will be discharged home he is not suicidal or homicidal.   Loney Hering, MD 08/15/16 [phone]

## 2016-08-15 NOTE — ED Notes (Signed)
Walker placed at bedside for pt, pt reports using walker at home

## 2016-08-22 ENCOUNTER — Encounter: Payer: Self-pay | Admitting: Emergency Medicine

## 2016-08-23 ENCOUNTER — Emergency Department: Payer: Medicare HMO

## 2016-08-23 ENCOUNTER — Emergency Department
Admission: EM | Admit: 2016-08-23 | Discharge: 2016-08-23 | Disposition: A | Discharge status: Home / Self Care | Payer: Medicare HMO | Attending: Student in an Organized Health Care Education/Training Program | Admitting: Student in an Organized Health Care Education/Training Program

## 2016-08-23 ENCOUNTER — Encounter: Payer: Self-pay | Admitting: Emergency Medicine

## 2016-08-23 DIAGNOSIS — J449 Chronic obstructive pulmonary disease, unspecified: Secondary | ICD-10-CM | POA: Diagnosis not present

## 2016-08-23 DIAGNOSIS — M25552 Pain in left hip: Secondary | ICD-10-CM | POA: Insufficient documentation

## 2016-08-23 DIAGNOSIS — J45909 Unspecified asthma, uncomplicated: Secondary | ICD-10-CM | POA: Insufficient documentation

## 2016-08-23 DIAGNOSIS — M25551 Pain in right hip: Secondary | ICD-10-CM | POA: Diagnosis not present

## 2016-08-23 DIAGNOSIS — I10 Essential (primary) hypertension: Secondary | ICD-10-CM | POA: Diagnosis not present

## 2016-08-23 DIAGNOSIS — E119 Type 2 diabetes mellitus without complications: Secondary | ICD-10-CM | POA: Diagnosis not present

## 2016-08-23 DIAGNOSIS — Z87891 Personal history of nicotine dependence: Secondary | ICD-10-CM | POA: Diagnosis not present

## 2016-08-23 DIAGNOSIS — W19XXXA Unspecified fall, initial encounter: Secondary | ICD-10-CM

## 2016-08-23 DIAGNOSIS — R2689 Other abnormalities of gait and mobility: Secondary | ICD-10-CM | POA: Diagnosis present

## 2016-08-23 DIAGNOSIS — Z79899 Other long term (current) drug therapy: Secondary | ICD-10-CM | POA: Insufficient documentation

## 2016-08-23 DIAGNOSIS — Z7984 Long term (current) use of oral hypoglycemic drugs: Secondary | ICD-10-CM | POA: Diagnosis not present

## 2016-08-23 LAB — CBC WITH DIFFERENTIAL/PLATELET
BASOS ABS: 0.1 10*3/uL (ref 0–0.1)
BASOS PCT: 1 %
EOS ABS: 0.3 10*3/uL (ref 0–0.7)
Eosinophils Relative: 4 %
HCT: 38 % — ABNORMAL LOW (ref 40.0–52.0)
Hemoglobin: 12.7 g/dL — ABNORMAL LOW (ref 13.0–18.0)
LYMPHS PCT: 31 %
Lymphs Abs: 2.5 10*3/uL (ref 1.0–3.6)
MCH: 28.3 pg (ref 26.0–34.0)
MCHC: 33.3 g/dL (ref 32.0–36.0)
MCV: 84.8 fL (ref 80.0–100.0)
MONO ABS: 0.8 10*3/uL (ref 0.2–1.0)
Monocytes Relative: 10 %
Neutro Abs: 4.4 10*3/uL (ref 1.4–6.5)
Neutrophils Relative %: 54 %
PLATELETS: 241 10*3/uL (ref 150–440)
RBC: 4.48 MIL/uL (ref 4.40–5.90)
RDW: 13.5 % (ref 11.5–14.5)
WBC: 8.1 10*3/uL (ref 3.8–10.6)

## 2016-08-23 LAB — BASIC METABOLIC PANEL
ANION GAP: 7 (ref 5–15)
BUN: 16 mg/dL (ref 6–20)
CALCIUM: 8.9 mg/dL (ref 8.9–10.3)
CO2: 27 mmol/L (ref 22–32)
Chloride: 107 mmol/L (ref 101–111)
Creatinine, Ser: 0.84 mg/dL (ref 0.61–1.24)
Glucose, Bld: 141 mg/dL — ABNORMAL HIGH (ref 65–99)
POTASSIUM: 3.7 mmol/L (ref 3.5–5.1)
SODIUM: 141 mmol/L (ref 135–145)

## 2016-08-23 NOTE — ED Triage Notes (Signed)
Pt arrived to ED via EMS from [facility] where EMS reports pt was using walker to get into bed and lost balance, landing on his buttocks and hitting back on wall. Pt c/o lower back pain and burning in legs. No obvious deformity noted at this time.

## 2016-08-23 NOTE — ED Provider Notes (Signed)
[facility] Emergency Department Provider Note    First MD Initiated Contact with Patient 08/23/16 2117     (approximate)  I have reviewed the triage vital signs and the nursing notes.   HISTORY  Chief Complaint Fall    HPI SOHIL TIMKO is a 71 y.o. male just the ER after fall from standing at his group home. Patient states he was walking back to his room for watching TV.[name] When turning the corner he lost his balance and fell onto the ground hurting his hip. Cannot recall if he hit his head but does not endorse any loss of consciousness. States he did not have any chest pain or shortness of breath. No numbness or tingling. Patient initially came due to severe hip pain but states that his pain is significant improved at this time.   Past Medical History:  Diagnosis Date  . Asthma   . Atrial flutter ([place])    s/p ablation 08-2013  . COPD (chronic obstructive pulmonary disease) ([place])   . Depression   . Diabetes mellitus without complication ([place])    TYPE 2   . Hypertension   . Nonsustained ventricular tachycardia (HCC)    no clinical significance at present  . Prostate cancer ([place])   . S/P hip replacement    remote  . Ulcerative colitis ([place])    HX OF    Family History  Problem Relation Age of Onset  . Heart attack Father    Past Surgical History:  Procedure Laterality Date  . ABLATION  09/07/2013   CTI ablation by Dr Caryl Comes  . ATRIAL FLUTTER ABLATION N/A 09/07/2013   Procedure: ATRIAL FLUTTER ABLATION;  Surgeon: Deboraha Sprang, MD;  Location: [facility] CATH LAB;  Service: Cardiovascular;  Laterality: N/A;  . BACK SURGERY    . HIP SURGERY Right   . ILEOSTOMY    . TONSILLECTOMY     Patient Active Problem List   Diagnosis Date Noted  . MDD (major depressive disorder), recurrent episode, moderate ([place]) 03/01/2015  . Iatrogenic hypotension 02/20/2013  . Ventricular tachycardia-nonsustained 02/20/2013  . BRONCHITIS NOT SPECIFIED AS ACUTE OR CHRONIC  10/03/2009  . DYSLIPIDEMIA 09/30/2008  . ESSENTIAL HYPERTENSION, BENIGN 09/30/2008  . Atrial flutter ([place]) 09/30/2008  . PALPITATIONS 09/30/2008      Prior to Admission medications   Medication Sig Start Date End Date Taking? Authorizing Provider  atorvastatin (LIPITOR) 10 MG tablet Take 10 mg by mouth at bedtime.    [provider]  Calcium Carbonate-Vitamin D (CALCIUM 600+D) 600-400 MG-UNIT tablet Take 1 tablet by mouth 2 (two) times daily with a meal.    [provider]  cetirizine (ZYRTEC) 10 MG tablet Take 10 mg by mouth daily.    [provider]  diltiazem (CARDIZEM CD) 120 MG 24 hr capsule Take 1 capsule (120 mg total) by mouth daily. 04/24/14   Deboraha Sprang, MD  metFORMIN (GLUCOPHAGE) 500 MG tablet Take 500 mg by mouth 2 (two) times daily with a meal.     [provider]  mometasone (ASMANEX) 220 MCG/INH inhaler Inhale 2 puffs into the lungs 2 (two) times daily.     [provider]  traZODone (DESYREL) 100 MG tablet Take 100 mg by mouth at bedtime.    [provider]  TRELSTAR MIXJECT 22.5 MG injection Inject 22.5 mg into the muscle every 6 (six) months.     [provider]  venlafaxine XR (EFFEXOR-XR) 150 MG 24 hr capsule Take 150  mg by mouth at bedtime.    [provider]    Allergies Cefuroxime; Clarithromycin; Erythromycin; Isradipine; Morphine; Penicillins; and Sulfonamide derivatives    Social History Social History  Substance Use Topics  . Smoking status: Former Smoker    Packs/day: 3.00    Years: 10.00    Types: Cigarettes    Quit date: 12/03/1988  . Smokeless tobacco: Never Used  . Alcohol use No    Review of Systems Patient denies headaches, rhinorrhea, blurry vision, numbness, shortness of breath, chest pain, edema, cough, abdominal pain, nausea, vomiting, diarrhea, dysuria, fevers, rashes or hallucinations unless otherwise stated above in  HPI. ____________________________________________   PHYSICAL EXAM:  VITAL SIGNS: Vitals:   08/23/16 2111  BP: (!) 121/54  Pulse: 68  Resp: 18  Temp: 98.1 F (36.7 C)    Constitutional: Alert, chronically ill appearing, in no acute distress. Eyes: Conjunctivae are normal.  Head: small contusion to posterior scalp, no swelling, battle sign, laceration Nose: No congestion/rhinnorhea. Mouth/Throat: Mucous membranes are moist.   Neck: No stridor. Painless ROM.  Cardiovascular: Normal rate, regular rhythm. Grossly normal heart sounds.  Good peripheral circulation. Respiratory: Normal respiratory effort.  No retractions. Lungs CTAB. Gastrointestinal: Soft and nontender.  S/p ileostomy.  Appears c/d/i. No distention. No abdominal bruits. No CVA tenderness. Musculoskeletal: No lower extremity tenderness, 1+ bilateral LE edema.    No joint effusions. Neurologic:  Normal speech and language. No gross focal neurologic deficits are appreciated. No facial droop Skin:  Skin is warm, dry and intact. No rash noted. Psychiatric: Mood and affect are normal. Speech and behavior are normal.  ____________________________________________   LABS (all labs ordered are listed, but only abnormal results are displayed)  Results for orders placed or performed during the hospital encounter of 08/23/16 (from the past 24 hour(s))  CBC with Differential/Platelet     Status: Abnormal   Collection Time: 08/23/16  9:45 PM  Result Value Ref Range   WBC 8.1 3.8 - 10.6 K/uL   RBC 4.48 4.40 - 5.90 MIL/uL   Hemoglobin 12.7 (L) 13.0 - 18.0 g/dL   HCT 38.0 (L) 40.0 - 52.0 %   MCV 84.8 80.0 - 100.0 fL   MCH 28.3 26.0 - 34.0 pg   MCHC 33.3 32.0 - 36.0 g/dL   RDW 13.5 11.5 - 14.5 %   Platelets 241 150 - 440 K/uL   Neutrophils Relative % 54 %   Neutro Abs 4.4 1.4 - 6.5 K/uL   Lymphocytes Relative 31 %   Lymphs Abs 2.5 1.0 - 3.6 K/uL   Monocytes Relative 10 %   Monocytes Absolute 0.8 0.2 - 1.0 K/uL    Eosinophils Relative 4 %   Eosinophils Absolute 0.3 0 - 0.7 K/uL   Basophils Relative 1 %   Basophils Absolute 0.1 0 - 0.1 K/uL  Basic metabolic panel     Status: Abnormal   Collection Time: 08/23/16  9:45 PM  Result Value Ref Range   Sodium 141 135 - 145 mmol/L   Potassium 3.7 3.5 - 5.1 mmol/L   Chloride 107 101 - 111 mmol/L   CO2 27 22 - 32 mmol/L   Glucose, Bld 141 (H) 65 - 99 mg/dL   BUN 16 6 - 20 mg/dL   Creatinine, Ser 0.84 0.61 - 1.24 mg/dL   Calcium 8.9 8.9 - 10.3 mg/dL   GFR calc non Af Amer >60 >60 mL/min   GFR calc Af Amer >60 >60 mL/min   Anion gap 7 5 -  15   ____________________________________________  EKG My review and personal interpretation at Time: 22:05   Indication: fall  Rate: 60  Rhythm: sinus Axis: normal Other: non specific st changes that are consistent with previous EKG 03/02/15 ____________________________________________  RADIOLOGY  I personally reviewed all radiographic images ordered to evaluate for the above acute complaints and reviewed radiology reports and findings.  These findings were personally discussed with the patient.  Please see medical record for radiology report.  ____________________________________________   PROCEDURES  Procedure(s) performed:  Procedures    Critical Care performed: no ____________________________________________   INITIAL IMPRESSION / ASSESSMENT AND PLAN / ED COURSE  Pertinent labs & imaging results that were available during my care of the patient were reviewed by me and considered in my medical decision making (see chart for details).  DDX: fracture, contusion, electrolyte abn, dehydration, cva, sdh, anemia  ARNELL SLIVINSKI is a 71 y.o. who presents to the ED with What sounds to be primarily mechanical fall from standing. Showing no focal neuro deficits at this time. CT imaging shows no evidence of traumatic injury to the brain. He has no persistent deficits to suggest TIA or CVA. EKG shows no  evidence of dysrhythmia. Blood work sent to evaluate for the above differential shows no evidence of left foot abnormality. No acute anemia. Patient without any evidence of pelvic fracture. Able to ambulate with use a walker with no difficulties. This provided for the patient is stable for follow-up with his PCP.      ____________________________________________   FINAL CLINICAL IMPRESSION(S) / ED DIAGNOSES  Final diagnoses:  Fall, initial encounter  Pain of both hip joints      NEW MEDICATIONS STARTED DURING THIS VISIT:  New Prescriptions   No medications on file     Note:  This document was prepared using Dragon voice recognition software and may include unintentional dictation errors.    Merlyn Lot, MD 08/23/16 2229

## 2016-08-23 NOTE — ED Notes (Signed)
Patient transported to CT 

## 2016-08-23 NOTE — ED Notes (Signed)
Pt went to CT

## 2016-08-23 NOTE — ED Notes (Signed)
Mallie Mussel RN talked to Cola, the Pt's care giver at [facility] [phone]) and she states that the Pt is his own legal guardian. Report was given to Cola, she states that [facility] is going to send a [employer] cab for the Pt. Pt will be discharged to the [facility] under the supervision of staff in the [place] until placed on the cab to go back to [facility].

## 2016-08-27 ENCOUNTER — Institutional Professional Consult (permissible substitution): Payer: Medicare HMO | Admitting: Radiation Oncology

## 2016-08-30 ENCOUNTER — Encounter: Payer: Self-pay | Admitting: Radiation Oncology

## 2016-08-30 ENCOUNTER — Ambulatory Visit
Admission: RE | Admit: 2016-08-30 | Discharge: 2016-08-30 | Disposition: A | Discharge status: Home / Self Care | Payer: Medicare HMO | Source: Ambulatory Visit | Attending: Radiation Oncology | Admitting: Radiation Oncology

## 2016-08-30 VITALS — BP 148/75 | HR 85 | Temp 98.2°F | Resp 18 | Wt 185.4 lb

## 2016-08-30 DIAGNOSIS — C61 Malignant neoplasm of prostate: Secondary | ICD-10-CM

## 2016-08-30 NOTE — Progress Notes (Signed)
Patient was referred by accident to radiation oncology according to referring physician he should have been to medical oncology for continuation of his ADT therapy now that the patient is on Medicaid

## 2016-09-08 ENCOUNTER — Inpatient Hospital Stay: Payer: Medicare HMO | Admitting: Oncology

## 2016-09-24 ENCOUNTER — Emergency Department: Payer: Medicare HMO

## 2016-09-24 ENCOUNTER — Emergency Department
Admission: EM | Admit: 2016-09-24 | Discharge: 2016-09-24 | Disposition: A | Discharge status: Home / Self Care | Payer: Medicare HMO | Attending: Emergency Medicine | Admitting: Emergency Medicine

## 2016-09-24 ENCOUNTER — Inpatient Hospital Stay: Payer: Medicare HMO | Admitting: Oncology

## 2016-09-24 ENCOUNTER — Encounter: Payer: Self-pay | Admitting: Emergency Medicine

## 2016-09-24 DIAGNOSIS — J449 Chronic obstructive pulmonary disease, unspecified: Secondary | ICD-10-CM | POA: Diagnosis not present

## 2016-09-24 DIAGNOSIS — E119 Type 2 diabetes mellitus without complications: Secondary | ICD-10-CM | POA: Insufficient documentation

## 2016-09-24 DIAGNOSIS — S7001XA Contusion of right hip, initial encounter: Secondary | ICD-10-CM | POA: Insufficient documentation

## 2016-09-24 DIAGNOSIS — I10 Essential (primary) hypertension: Secondary | ICD-10-CM | POA: Insufficient documentation

## 2016-09-24 DIAGNOSIS — Z79899 Other long term (current) drug therapy: Secondary | ICD-10-CM | POA: Insufficient documentation

## 2016-09-24 DIAGNOSIS — W19XXXA Unspecified fall, initial encounter: Secondary | ICD-10-CM | POA: Insufficient documentation

## 2016-09-24 DIAGNOSIS — S79911A Unspecified injury of right hip, initial encounter: Secondary | ICD-10-CM | POA: Diagnosis present

## 2016-09-24 DIAGNOSIS — J45909 Unspecified asthma, uncomplicated: Secondary | ICD-10-CM | POA: Insufficient documentation

## 2016-09-24 DIAGNOSIS — Z7984 Long term (current) use of oral hypoglycemic drugs: Secondary | ICD-10-CM | POA: Insufficient documentation

## 2016-09-24 DIAGNOSIS — Z87891 Personal history of nicotine dependence: Secondary | ICD-10-CM | POA: Diagnosis not present

## 2016-09-24 DIAGNOSIS — Y999 Unspecified external cause status: Secondary | ICD-10-CM | POA: Diagnosis not present

## 2016-09-24 DIAGNOSIS — S8011XA Contusion of right lower leg, initial encounter: Secondary | ICD-10-CM | POA: Insufficient documentation

## 2016-09-24 DIAGNOSIS — Y939 Activity, unspecified: Secondary | ICD-10-CM | POA: Insufficient documentation

## 2016-09-24 DIAGNOSIS — S7011XA Contusion of right thigh, initial encounter: Secondary | ICD-10-CM

## 2016-09-24 DIAGNOSIS — Y929 Unspecified place or not applicable: Secondary | ICD-10-CM | POA: Diagnosis not present

## 2016-09-24 DIAGNOSIS — Y92009 Unspecified place in unspecified non-institutional (private) residence as the place of occurrence of the external cause: Secondary | ICD-10-CM

## 2016-09-24 DIAGNOSIS — Z96641 Presence of right artificial hip joint: Secondary | ICD-10-CM | POA: Diagnosis not present

## 2016-09-24 NOTE — Discharge Instructions (Signed)
May take Tylenol as needed for pain. Ice packs to your hip or thigh as needed for any swelling or pain. Follow up with your primary care doctor if any continued problems.

## 2016-09-24 NOTE — ED Provider Notes (Signed)
[facility] Emergency Department Provider Note  ___________________________________________   First MD Initiated Contact with Patient 09/24/16 1300     (approximate)  I have reviewed the triage vital signs and the nursing notes.   HISTORY  Chief Complaint Fall   HPI [name] is a 71 y.o. male is here with complaint of right hip and thigh pain. Patient states that he fell this morning. He denies any head injury or loss of consciousness. Patient states this was not a syncopal episode that more like he lost his footing. Patient states that he has not walked since his accident because "my leg feels weak". Patient was brought from [facility].     Past Medical History:  Diagnosis Date  . Asthma   . Atrial flutter ([place])    s/p ablation 08-2013  . COPD (chronic obstructive pulmonary disease) ([place])   . Depression   . Diabetes mellitus without complication ([place])    TYPE 2   . Hypertension   . Nonsustained ventricular tachycardia (HCC)    no clinical significance at present  . Prostate cancer ([place])   . S/P hip replacement    remote  . Ulcerative colitis ([place])    HX OF     Patient Active Problem List   Diagnosis Date Noted  . MDD (major depressive disorder), recurrent episode, moderate ([place]) 03/01/2015  . Iatrogenic hypotension 02/20/2013  . Ventricular tachycardia-nonsustained 02/20/2013  . BRONCHITIS NOT SPECIFIED AS ACUTE OR CHRONIC 10/03/2009  . DYSLIPIDEMIA 09/30/2008  . ESSENTIAL HYPERTENSION, BENIGN 09/30/2008  . Atrial flutter ([place]) 09/30/2008  . PALPITATIONS 09/30/2008    Past Surgical History:  Procedure Laterality Date  . ABLATION  09/07/2013   CTI ablation by Dr Caryl Comes  . ATRIAL FLUTTER ABLATION N/A 09/07/2013   Procedure: ATRIAL FLUTTER ABLATION;  Surgeon: Deboraha Sprang, MD;  Location: [facility] CATH LAB;  Service: Cardiovascular;  Laterality: N/A;  . BACK SURGERY    . HIP SURGERY Right   . ILEOSTOMY    . TONSILLECTOMY       Prior to Admission medications   Medication Sig Start Date End Date Taking? Authorizing Provider  atorvastatin (LIPITOR) 10 MG tablet Take 10 mg by mouth at bedtime.    [provider]  Calcium Carbonate-Vitamin D (CALCIUM 600+D) 600-400 MG-UNIT tablet Take 1 tablet by mouth 2 (two) times daily with a meal.    [provider]  cetirizine (ZYRTEC) 10 MG tablet Take 10 mg by mouth daily.    [provider]  diltiazem (CARDIZEM CD) 120 MG 24 hr capsule Take 1 capsule (120 mg total) by mouth daily. 04/24/14   Deboraha Sprang, MD  metFORMIN (GLUCOPHAGE) 500 MG tablet Take 500 mg by mouth 2 (two) times daily with a meal.     [provider]  mometasone (ASMANEX) 220 MCG/INH inhaler Inhale 2 puffs into the lungs 2 (two) times daily.     [provider]  traZODone (DESYREL) 100 MG tablet Take 100 mg by mouth at bedtime.    [provider]  TRELSTAR MIXJECT 22.5 MG injection Inject 22.5 mg into the muscle every 6 (six) months.     [provider]  venlafaxine XR (EFFEXOR-XR) 150 MG 24 hr capsule Take 150 mg by mouth at bedtime.    [provider]    Allergies Cefuroxime; Clarithromycin; Erythromycin; Isradipine; Morphine; Penicillins; and Sulfonamide derivatives  Family History  Problem Relation Age of Onset  . Heart attack Father  Social History Social History  Substance Use Topics  . Smoking status: Former Smoker    Packs/day: 3.00    Years: 10.00    Types: Cigarettes    Quit date: 12/03/1988  . Smokeless tobacco: Never Used  . Alcohol use No    Review of Systems Constitutional: No fever/chills Eyes: No visual changes. ENT: No trauma Cardiovascular: Denies chest pain. Respiratory: Denies shortness of breath. Gastrointestinal: No abdominal pain.  No nausea, no vomiting.  Musculoskeletal: Positive for right hip pain. Positive for right thigh pain. Skin: Negative for lacerations or abrasions. Neurological:  Negative for headaches, focal weakness or numbness. ____________________________________________   PHYSICAL EXAM:  VITAL SIGNS: ED Triage Vitals [09/24/16 1235]  Enc Vitals Group     BP (!) 123/53     Pulse Rate 85     Resp 18     Temp 98.5 F (36.9 C)     Temp Source Oral     SpO2 93 %     Weight 183 lb (83 kg)     Height 5' 9"  (1.753 m)     Head Circumference      Peak Flow      Pain Score      Pain Loc      Pain Edu?      Excl. in [place]?    Constitutional: Alert and oriented. Well appearing and in no acute distress.  Patient answers questions appropriately. Eyes: Conjunctivae are normal. PERRL. EOMI. Head: Atraumatic. Nose: No trauma Neck: No stridor.   Cardiovascular: Normal rate, regular rhythm. Grossly normal heart sounds.  Good peripheral circulation. Respiratory: Normal respiratory effort.  No retractions. Lungs CTAB. Gastrointestinal: Soft and nontender. No distention. Musculoskeletal: On examination of the right hip there is tenderness to palpation on the lateral aspect. There is no soft tissue swelling or bruising noted. Range of motion is limited secondary to discomfort. There is also tenderness on palpation of the anterior thigh and knee. There is no effusion noted on the right knee and no abrasions were noted. Patient is able to flex and extend lower extremity without any difficulty. There is no shortening of his extremity or rotation noted. No ecchymosis or abrasions are noted of the right lower extremity. After x-ray patient was able to move right hip and lower extremity without any assistance. Neurologic:  Normal speech and language. No gross focal neurologic deficits are appreciated.  Skin:  Skin is warm, dry and intact. As noted above. Psychiatric: Mood and affect are normal. Speech and behavior are normal.  ____________________________________________   LABS (all labs ordered are listed, but only abnormal results are displayed)  Labs Reviewed - No data to  display  RADIOLOGY  Dg Tibia/fibula Right  Result Date: 09/24/2016 CLINICAL DATA:  Pt fell today and is having right hip and lower leg pain. Previous right hip replacement. EXAM: RIGHT TIBIA AND FIBULA - 2 VIEW COMPARISON:  08/23/2016 pelvis FINDINGS: There is no acute fracture or subluxation. Vascular calcifications are present. No radiopaque foreign body or soft tissue gas. IMPRESSION: No evidence for acute  abnormality. Electronically Signed   By: Nolon Nations M.D.   On: 09/24/2016 13:56   Dg Hip Unilat W Or Wo Pelvis 2-3 Views Right  Result Date: 09/24/2016 CLINICAL DATA:  Pt fell today and is having right hip and lower leg pain. Previous right hip replacement. EXAM: DG HIP (WITH OR WITHOUT PELVIS) 2-3V RIGHT COMPARISON:  None. FINDINGS: The patient has had right hip arthroplasty. There is no acute fracture  or subluxation. Prior posterior fusion of the lumbar spine. Visualized bowel gas pattern is nonobstructive. There is atherosclerotic calcification of the femoral arteries. Significant degenerative changes identified in the left hip. IMPRESSION: Status post right hip arthroplasty.  No acute fracture. Electronically Signed   By: Nolon Nations M.D.   On: 09/24/2016 13:55    ____________________________________________   PROCEDURES  Procedure(s) performed: None  Procedures  Critical Care performed: No  ____________________________________________   INITIAL IMPRESSION / ASSESSMENT AND PLAN / ED COURSE  Pertinent labs & imaging results that were available during my care of the patient were reviewed by me and considered in my medical decision making (see chart for details).  Patient is instructed to take Tylenol as needed for pain. He is aware that he does have a "lot of arthritis". Patient is follow-up with his PCP if any continued problems.   ___________________________________________   FINAL CLINICAL IMPRESSION(S) / ED DIAGNOSES  Final diagnoses:  Contusion of right  hip and thigh, initial encounter  Contusion of right lower extremity, initial encounter  Fall in home, initial encounter      NEW MEDICATIONS STARTED DURING THIS VISIT:  New Prescriptions   No medications on file     Note:  This document was prepared using Dragon voice recognition software and may include unintentional dictation errors.    Johnn Hai, PA-C 09/24/16 1441    Hinda Kehr, MD 09/24/16 [phone]

## 2016-09-24 NOTE — ED Notes (Signed)
Spoke with group home personnel  States she is not able to come and get pt  Advised to call EMS for transport

## 2016-09-24 NOTE — ED Triage Notes (Signed)
Brought in from [facility] s/p fall  Having pain to right hip and leg pain

## 2016-09-28 ENCOUNTER — Emergency Department: Payer: Medicare Other

## 2016-09-28 ENCOUNTER — Inpatient Hospital Stay
Admission: EM | Admit: 2016-09-28 | Discharge: 2016-09-30 | DRG: 872 | Disposition: A | Discharge status: Skilled Nursing Facility | Payer: Medicare Other | Attending: Specialist | Admitting: Specialist

## 2016-09-28 ENCOUNTER — Inpatient Hospital Stay: Payer: Medicare HMO | Admitting: Oncology

## 2016-09-28 ENCOUNTER — Encounter: Payer: Self-pay | Admitting: Emergency Medicine

## 2016-09-28 DIAGNOSIS — R000 Tachycardia, unspecified: Secondary | ICD-10-CM | POA: Diagnosis present

## 2016-09-28 DIAGNOSIS — R350 Frequency of micturition: Secondary | ICD-10-CM | POA: Diagnosis present

## 2016-09-28 DIAGNOSIS — R531 Weakness: Secondary | ICD-10-CM | POA: Diagnosis present

## 2016-09-28 DIAGNOSIS — Z932 Ileostomy status: Secondary | ICD-10-CM | POA: Diagnosis not present

## 2016-09-28 DIAGNOSIS — R262 Difficulty in walking, not elsewhere classified: Secondary | ICD-10-CM | POA: Diagnosis present

## 2016-09-28 DIAGNOSIS — N401 Enlarged prostate with lower urinary tract symptoms: Secondary | ICD-10-CM | POA: Diagnosis present

## 2016-09-28 DIAGNOSIS — R296 Repeated falls: Secondary | ICD-10-CM | POA: Diagnosis present

## 2016-09-28 DIAGNOSIS — F329 Major depressive disorder, single episode, unspecified: Secondary | ICD-10-CM | POA: Diagnosis not present

## 2016-09-28 DIAGNOSIS — E119 Type 2 diabetes mellitus without complications: Secondary | ICD-10-CM | POA: Diagnosis present

## 2016-09-28 DIAGNOSIS — Z96649 Presence of unspecified artificial hip joint: Secondary | ICD-10-CM | POA: Diagnosis not present

## 2016-09-28 DIAGNOSIS — Z8249 Family history of ischemic heart disease and other diseases of the circulatory system: Secondary | ICD-10-CM

## 2016-09-28 DIAGNOSIS — Z881 Allergy status to other antibiotic agents status: Secondary | ICD-10-CM

## 2016-09-28 DIAGNOSIS — E785 Hyperlipidemia, unspecified: Secondary | ICD-10-CM | POA: Diagnosis present

## 2016-09-28 DIAGNOSIS — J449 Chronic obstructive pulmonary disease, unspecified: Secondary | ICD-10-CM | POA: Diagnosis not present

## 2016-09-28 DIAGNOSIS — L89159 Pressure ulcer of sacral region, unspecified stage: Secondary | ICD-10-CM | POA: Diagnosis present

## 2016-09-28 DIAGNOSIS — A419 Sepsis, unspecified organism: Secondary | ICD-10-CM | POA: Diagnosis present

## 2016-09-28 DIAGNOSIS — Z880 Allergy status to penicillin: Secondary | ICD-10-CM

## 2016-09-28 DIAGNOSIS — Z8546 Personal history of malignant neoplasm of prostate: Secondary | ICD-10-CM

## 2016-09-28 DIAGNOSIS — R300 Dysuria: Secondary | ICD-10-CM | POA: Diagnosis present

## 2016-09-28 DIAGNOSIS — Z882 Allergy status to sulfonamides status: Secondary | ICD-10-CM

## 2016-09-28 DIAGNOSIS — Z87891 Personal history of nicotine dependence: Secondary | ICD-10-CM

## 2016-09-28 DIAGNOSIS — I10 Essential (primary) hypertension: Secondary | ICD-10-CM | POA: Diagnosis not present

## 2016-09-28 LAB — GLUCOSE, CAPILLARY: Glucose-Capillary: 165 mg/dL — ABNORMAL HIGH (ref 65–99)

## 2016-09-28 LAB — COMPREHENSIVE METABOLIC PANEL
ALK PHOS: 65 U/L (ref 38–126)
ALT: 12 U/L — ABNORMAL LOW (ref 17–63)
ANION GAP: 9 (ref 5–15)
AST: 28 U/L (ref 15–41)
Albumin: 3.1 g/dL — ABNORMAL LOW (ref 3.5–5.0)
BUN: 12 mg/dL (ref 6–20)
CO2: 25 mmol/L (ref 22–32)
CREATININE: 0.87 mg/dL (ref 0.61–1.24)
Calcium: 8.7 mg/dL — ABNORMAL LOW (ref 8.9–10.3)
Chloride: 101 mmol/L (ref 101–111)
Glucose, Bld: 253 mg/dL — ABNORMAL HIGH (ref 65–99)
Potassium: 3.7 mmol/L (ref 3.5–5.1)
Sodium: 135 mmol/L (ref 135–145)
Total Bilirubin: 1.2 mg/dL (ref 0.3–1.2)
Total Protein: 7.2 g/dL (ref 6.5–8.1)

## 2016-09-28 LAB — CBC
HCT: 36.4 % — ABNORMAL LOW (ref 40.0–52.0)
HEMOGLOBIN: 12.6 g/dL — AB (ref 13.0–18.0)
MCH: 28.8 pg (ref 26.0–34.0)
MCHC: 34.7 g/dL (ref 32.0–36.0)
MCV: 83 fL (ref 80.0–100.0)
Platelets: 237 10*3/uL (ref 150–440)
RBC: 4.39 MIL/uL — ABNORMAL LOW (ref 4.40–5.90)
RDW: 13.4 % (ref 11.5–14.5)
WBC: 23.1 10*3/uL — ABNORMAL HIGH (ref 3.8–10.6)

## 2016-09-28 LAB — URINALYSIS, COMPLETE (UACMP) WITH MICROSCOPIC
BACTERIA UA: NONE SEEN
BILIRUBIN URINE: NEGATIVE
Glucose, UA: 150 mg/dL — AB
HGB URINE DIPSTICK: NEGATIVE
Ketones, ur: 5 mg/dL — AB
LEUKOCYTES UA: NEGATIVE
Nitrite: NEGATIVE
PROTEIN: 30 mg/dL — AB
RBC / HPF: NONE SEEN RBC/hpf (ref 0–5)
SQUAMOUS EPITHELIAL / LPF: NONE SEEN
Specific Gravity, Urine: 1.021 (ref 1.005–1.030)
pH: 5 (ref 5.0–8.0)

## 2016-09-28 LAB — LACTIC ACID, PLASMA: LACTIC ACID, VENOUS: 1.8 mmol/L (ref 0.5–1.9)

## 2016-09-28 LAB — HEMOGLOBIN A1C
HEMOGLOBIN A1C: 6.6 % — AB (ref 4.8–5.6)
MEAN PLASMA GLUCOSE: 142.72 mg/dL

## 2016-09-28 LAB — TROPONIN I: Troponin I: <0.03 ng/mL (ref <0.03)

## 2016-09-28 LAB — TSH: TSH: 2.018 u[IU]/mL (ref 0.350–4.500)

## 2016-09-28 LAB — LIPASE, BLOOD: Lipase: 12 U/L (ref 11–51)

## 2016-09-28 MED ORDER — INSULIN ASPART 100 UNIT/ML ~~LOC~~ SOLN
0.0000 [IU] | Freq: Three times a day (TID) | SUBCUTANEOUS | Status: DC
  Administered 2016-09-29: 2 [IU] via SUBCUTANEOUS
  Administered 2016-09-29: 1 [IU] via SUBCUTANEOUS
  Administered 2016-09-29: 3 [IU] via SUBCUTANEOUS
  Administered 2016-09-30: 1 [IU] via SUBCUTANEOUS
  Administered 2016-09-30: 3 [IU] via SUBCUTANEOUS
  Filled 2016-09-28 (×5): qty 1

## 2016-09-28 MED ORDER — POLYETHYLENE GLYCOL 3350 17 G PO PACK: 17.0000 g | PACK | Freq: Every day | ORAL | Status: DC | PRN

## 2016-09-28 MED ORDER — SODIUM CHLORIDE 0.9 % IV SOLN
1000.0000 mL | Freq: Once | INTRAVENOUS | Status: AC
  Administered 2016-09-28: 1000 mL via INTRAVENOUS

## 2016-09-28 MED ORDER — DILTIAZEM HCL ER COATED BEADS 120 MG PO CP24
120.0000 mg | ORAL_CAPSULE | Freq: Every day | ORAL | Status: DC
  Administered 2016-09-29 – 2016-09-30 (×2): 120 mg via ORAL
  Filled 2016-09-28 (×2): qty 1

## 2016-09-28 MED ORDER — TRAMADOL HCL 50 MG PO TABS: 50.0000 mg | ORAL_TABLET | Freq: Four times a day (QID) | ORAL | Status: DC | PRN

## 2016-09-28 MED ORDER — SODIUM CHLORIDE 0.9 % IV SOLN
INTRAVENOUS | Status: AC
  Administered 2016-09-28 – 2016-09-29 (×2): via INTRAVENOUS

## 2016-09-28 MED ORDER — TAMSULOSIN HCL 0.4 MG PO CAPS
0.4000 mg | ORAL_CAPSULE | Freq: Every day | ORAL | Status: DC
  Administered 2016-09-29 – 2016-09-30 (×2): 0.4 mg via ORAL
  Filled 2016-09-28 (×2): qty 1

## 2016-09-28 MED ORDER — VENLAFAXINE HCL ER 75 MG PO CP24
150.0000 mg | ORAL_CAPSULE | Freq: Every day | ORAL | Status: DC
  Administered 2016-09-28 – 2016-09-29 (×2): 150 mg via ORAL
  Filled 2016-09-28 (×2): qty 2

## 2016-09-28 MED ORDER — LEVOFLOXACIN IN D5W 750 MG/150ML IV SOLN: 750.0000 mg | INTRAVENOUS | Status: DC

## 2016-09-28 MED ORDER — BUDESONIDE 0.5 MG/2ML IN SUSP
2.0000 mL | Freq: Two times a day (BID) | RESPIRATORY_TRACT | Status: DC
  Administered 2016-09-28 – 2016-09-30 (×4): 0.5 mg via RESPIRATORY_TRACT
  Filled 2016-09-28 (×4): qty 2

## 2016-09-28 MED ORDER — ENOXAPARIN SODIUM 40 MG/0.4ML ~~LOC~~ SOLN
40.0000 mg | SUBCUTANEOUS | Status: DC
  Administered 2016-09-28 – 2016-09-29 (×2): 40 mg via SUBCUTANEOUS
  Filled 2016-09-28 (×2): qty 0.4

## 2016-09-28 MED ORDER — DEXTROSE 5 % IV SOLN
2.0000 g | Freq: Three times a day (TID) | INTRAVENOUS | Status: DC
  Administered 2016-09-28: 2 g via INTRAVENOUS
  Filled 2016-09-28 (×3): qty 2

## 2016-09-28 MED ORDER — VANCOMYCIN HCL IN DEXTROSE 1-5 GM/200ML-% IV SOLN: 1000.0000 mg | Freq: Once | INTRAVENOUS | Status: DC

## 2016-09-28 MED ORDER — DEXTROSE 5 % IV SOLN
2.0000 g | Freq: Once | INTRAVENOUS | Status: DC
  Filled 2016-09-28: qty 2

## 2016-09-28 MED ORDER — ACETAMINOPHEN 325 MG PO TABS: 650.0000 mg | ORAL_TABLET | Freq: Four times a day (QID) | ORAL | Status: DC | PRN

## 2016-09-28 MED ORDER — LEVOFLOXACIN IN D5W 750 MG/150ML IV SOLN
750.0000 mg | Freq: Once | INTRAVENOUS | Status: AC
  Administered 2016-09-28: 750 mg via INTRAVENOUS
  Filled 2016-09-28: qty 150

## 2016-09-28 MED ORDER — INSULIN ASPART 100 UNIT/ML ~~LOC~~ SOLN
0.0000 [IU] | Freq: Every day | SUBCUTANEOUS | Status: DC
  Administered 2016-09-29: 2 [IU] via SUBCUTANEOUS
  Filled 2016-09-28: qty 1

## 2016-09-28 MED ORDER — ONDANSETRON HCL 4 MG/2ML IJ SOLN: 4.0000 mg | Freq: Four times a day (QID) | INTRAMUSCULAR | Status: DC | PRN

## 2016-09-28 MED ORDER — ATORVASTATIN CALCIUM 10 MG PO TABS
10.0000 mg | ORAL_TABLET | Freq: Every day | ORAL | Status: DC
  Administered 2016-09-28: 10 mg via ORAL
  Filled 2016-09-28 (×2): qty 1

## 2016-09-28 MED ORDER — DOCUSATE SODIUM 100 MG PO CAPS
100.0000 mg | ORAL_CAPSULE | Freq: Two times a day (BID) | ORAL | Status: DC
  Administered 2016-09-28 – 2016-09-30 (×3): 100 mg via ORAL
  Filled 2016-09-28 (×4): qty 1

## 2016-09-28 MED ORDER — OLOPATADINE HCL 0.1 % OP SOLN
1.0000 [drp] | Freq: Every day | OPHTHALMIC | Status: DC
  Administered 2016-09-29 – 2016-09-30 (×2): 1 [drp] via OPHTHALMIC
  Filled 2016-09-28: qty 5

## 2016-09-28 MED ORDER — VANCOMYCIN HCL 10 G IV SOLR
1250.0000 mg | Freq: Two times a day (BID) | INTRAVENOUS | Status: DC
  Administered 2016-09-28: 1250 mg via INTRAVENOUS
  Filled 2016-09-28 (×2): qty 1250

## 2016-09-28 MED ORDER — VANCOMYCIN HCL 10 G IV SOLR
1250.0000 mg | Freq: Two times a day (BID) | INTRAVENOUS | Status: DC
  Filled 2016-09-28: qty 1250

## 2016-09-28 MED ORDER — ONDANSETRON HCL 4 MG PO TABS: 4.0000 mg | ORAL_TABLET | Freq: Four times a day (QID) | ORAL | Status: DC | PRN

## 2016-09-28 MED ORDER — SODIUM CHLORIDE 0.9 % IV SOLN
1.0000 g | Freq: Three times a day (TID) | INTRAVENOUS | Status: DC
  Administered 2016-09-29 (×4): 1 g via INTRAVENOUS
  Filled 2016-09-28 (×9): qty 1

## 2016-09-28 MED ORDER — ACETAMINOPHEN 650 MG RE SUPP: 650.0000 mg | Freq: Four times a day (QID) | RECTAL | Status: DC | PRN

## 2016-09-28 NOTE — ED Notes (Signed)
Attempted to call report; nurse busy and bed "not approved yet;" timer set for 25 minutes and will call back.

## 2016-09-28 NOTE — ED Notes (Signed)
CODE SEPSIS CALLED TO DOUG AT [facility]

## 2016-09-28 NOTE — Progress Notes (Signed)
Pharmacy Antibiotic Note  [name] is a 71 y.o. male admitted on 09/28/2016 with sepsis.  Pharmacy has been consulted for aztreonam, vancomycin, levofloxacin dosing.  Plan: Vancomycin 1276m IV every 12 hours.  Goal trough 15-20 mcg/mL. Aztreonam 2gm iv q8h Levofloxacin 7560miv q24h  Height: 5' 9"  (175.3 cm) Weight: 185 lb (83.9 kg) IBW/kg (Calculated) : 70.7  Temp (24hrs), Avg:98.3 F (36.8 C), Min:98.3 F (36.8 C), Max:98.3 F (36.8 C)   Recent Labs Lab 09/28/16 0858 09/28/16 0958  WBC 23.1*  --   CREATININE 0.87  --   LATICACIDVEN  --  1.8    Estimated Creatinine Clearance: 79 mL/min (by C-G formula based on SCr of 0.87 mg/dL).    Allergies  Allergen Reactions  . Cefuroxime Rash  . Clarithromycin Rash  . Erythromycin Rash  . Isradipine Rash  . Morphine Itching and Other (See Comments)    Reaction:  Makes pt feel crazy   . Penicillins Rash and Other (See Comments)    Has patient had a PCN reaction causing immediate rash, facial/tongue/throat swelling, SOB or lightheadedness with hypotension: No Has patient had a PCN reaction causing severe rash involving mucus membranes or skin necrosis: No Has patient had a PCN reaction that required hospitalization: No Has patient had a PCN reaction occurring within the last 10 years: No If all of the above answers are "NO", then may proceed with Cephalosporin use.   . Sulfonamide Derivatives Rash    Antimicrobials this admission: Anti-infectives    Start     Dose/Rate Route Frequency Ordered Stop   09/29/16 1030  levofloxacin (LEVAQUIN) IVPB 750 mg     750 mg 100 mL/hr over 90 Minutes Intravenous Every 24 hours 09/28/16 1104     09/28/16 2200  aztreonam (AZACTAM) 2 g in dextrose 5 % 50 mL IVPB     2 g 100 mL/hr over 30 Minutes Intravenous Every 8 hours 09/28/16 1104     09/28/16 1600  vancomycin (VANCOCIN) 1,250 mg in sodium chloride 0.9 % 250 mL IVPB     1,250 mg 166.7 mL/hr over 90 Minutes Intravenous Every 12  hours 09/28/16 1108     09/28/16 1030  levofloxacin (LEVAQUIN) IVPB 750 mg     750 mg 100 mL/hr over 90 Minutes Intravenous  Once 09/28/16 1028     09/28/16 1030  aztreonam (AZACTAM) 2 g in dextrose 5 % 50 mL IVPB     2 g 100 mL/hr over 30 Minutes Intravenous  Once 09/28/16 1028     09/28/16 1030  vancomycin (VANCOCIN) IVPB 1000 mg/200 mL premix     1,000 mg 200 mL/hr over 60 Minutes Intravenous  Once 09/28/16 1028        Microbiology results: 9/11 BCx: pending x 2 9/11 UCx: pending    Thank you for allowing pharmacy to be a part of this patient's care.  GaDonna Christenoffee 09/28/2016 11:08 AM

## 2016-09-28 NOTE — ED Notes (Signed)
Ostomy bag changed.

## 2016-09-28 NOTE — ED Notes (Signed)
[name] with [facility] called for update. Pt gave verbal ok to update her with his condition.

## 2016-09-28 NOTE — ED Notes (Signed)
Meal tray given 

## 2016-09-28 NOTE — ED Notes (Signed)
Admitting at bedside 

## 2016-09-28 NOTE — ED Notes (Signed)
Pt given ginger ale.

## 2016-09-28 NOTE — Progress Notes (Addendum)
Pharmacy Antibiotic Note  [name] is a 71 y.o. male admitted on 09/28/2016 with sepsis.  Pharmacy has been consulted for aztreonam, vancomycin, levofloxacin dosing by ED. Admitting physician now consulting pharmacy for vancomycin and meropenem dosing.  Aztreonam and levofloxacin have been discontinued.   Plan: Continue Vancomycin 1280m IV every 12 hours.  Goal trough 15-20 mcg/mL.  Start Meropenem 1g Iv every 8 hours.   Height: 5' 9"  (175.3 cm) Weight: 185 lb (83.9 kg) IBW/kg (Calculated) : 70.7  Temp (24hrs), Avg:98.3 F (36.8 C), Min:98.3 F (36.8 C), Max:98.3 F (36.8 C)   Recent Labs Lab 09/28/16 0858 09/28/16 0958  WBC 23.1*  --   CREATININE 0.87  --   LATICACIDVEN  --  1.8    Estimated Creatinine Clearance: 79 mL/min (by C-G formula based on SCr of 0.87 mg/dL).    Allergies  Allergen Reactions  . Cefuroxime Rash  . Clarithromycin Rash  . Erythromycin Rash  . Isradipine Rash  . Morphine Itching and Other (See Comments)    Reaction:  Makes pt feel crazy   . Penicillins Rash and Other (See Comments)    Has patient had a PCN reaction causing immediate rash, facial/tongue/throat swelling, SOB or lightheadedness with hypotension: No Has patient had a PCN reaction causing severe rash involving mucus membranes or skin necrosis: No Has patient had a PCN reaction that required hospitalization: No Has patient had a PCN reaction occurring within the last 10 years: No If all of the above answers are "NO", then may proceed with Cephalosporin use.   . Sulfonamide Derivatives Rash    Antimicrobials this admission: Anti-infectives    Start     Dose/Rate Route Frequency Ordered Stop   09/29/16 1030  levofloxacin (LEVAQUIN) IVPB 750 mg  Status:  Discontinued     750 mg 100 mL/hr over 90 Minutes Intravenous Every 24 hours 09/28/16 1104 09/28/16 1722   09/28/16 2200  aztreonam (AZACTAM) 2 g in dextrose 5 % 50 mL IVPB  Status:  Discontinued     2 g 100 mL/hr over 30  Minutes Intravenous Every 8 hours 09/28/16 1104 09/28/16 1722   09/28/16 1600  vancomycin (VANCOCIN) 1,250 mg in sodium chloride 0.9 % 250 mL IVPB     1,250 mg 166.7 mL/hr over 90 Minutes Intravenous Every 12 hours 09/28/16 1108     09/28/16 1030  levofloxacin (LEVAQUIN) IVPB 750 mg     750 mg 100 mL/hr over 90 Minutes Intravenous  Once 09/28/16 1028 09/28/16 1208   09/28/16 1030  aztreonam (AZACTAM) 2 g in dextrose 5 % 50 mL IVPB     2 g 100 mL/hr over 30 Minutes Intravenous  Once 09/28/16 1028     09/28/16 1030  vancomycin (VANCOCIN) IVPB 1000 mg/200 mL premix     1,000 mg 200 mL/hr over 60 Minutes Intravenous  Once 09/28/16 1028        Microbiology results: 9/11 BCx: pending x 2 9/11 UCx: pending    Thank you for allowing pharmacy to be a part of this patient's care.  SPernell Dupre PharmD, BCPS Clinical Pharmacist 09/28/2016 5:24 PM

## 2016-09-28 NOTE — H&P (Signed)
[facility] NAME: [name]    MR#:  [id number]  DATE OF BIRTH:  [birth year]  DATE OF ADMISSION:  09/28/2016  PRIMARY CARE PHYSICIAN: Theotis Burrow, MD   REQUESTING/REFERRING PHYSICIAN: Dr. Lavonia Drafts  CHIEF COMPLAINT:  No chief complaint on file.   HISTORY OF PRESENT ILLNESS:  [name]  is a 71 y.o. male with a known history of atrial flutter status post ablation, COPD not on home oxygen, diabetes, hypertension, history of ulcerative colitis status post ileostomy presents to hospital secondary to weakness and low-grade fevers. Patient is from a care home, not a great historian. He is  Only oriented to self at this time. Patient states he has been feeling weak for a weak, and had a few falls lately. His hips have been hurting and he was unable to  Get around.He is noted to have a sacral decubitus ulcer. White count is noted to be elevated at 23k. No recent travel. No nausea or vomiting. Denies any abdominal pain or chest pain.was also noted to be tachycardic when he came to the emergency room. Heart rate improved after fluids. Complains of increased frequency of urination and also some dysuria.  PAST MEDICAL HISTORY:   Past Medical History:  Diagnosis Date  . Asthma   . Atrial flutter ([place])    s/p ablation 08-2013  . COPD (chronic obstructive pulmonary disease) ([place])   . Depression   . Diabetes mellitus without complication ([place])    TYPE 2   . Hypertension   . Nonsustained ventricular tachycardia (HCC)    no clinical significance at present  . Prostate cancer ([place])   . S/P hip replacement    remote  . Ulcerative colitis ([place])    HX OF     PAST SURGICAL HISTORY:   Past Surgical History:  Procedure Laterality Date  . ABLATION  09/07/2013   CTI ablation by Dr Caryl Comes  . ATRIAL FLUTTER ABLATION N/A 09/07/2013   Procedure: ATRIAL FLUTTER ABLATION;  Surgeon: Deboraha Sprang, MD;  Location: [facility] CATH LAB;   Service: Cardiovascular;  Laterality: N/A;  . BACK SURGERY    . HIP SURGERY Right   . ILEOSTOMY    . TONSILLECTOMY      SOCIAL HISTORY:   Social History  Substance Use Topics  . Smoking status: Former Smoker    Packs/day: 3.00    Years: 10.00    Types: Cigarettes    Quit date: 12/03/1988  . Smokeless tobacco: Never Used  . Alcohol use No    FAMILY HISTORY:   Family History  Problem Relation Age of Onset  . Heart attack Father     DRUG ALLERGIES:   Allergies  Allergen Reactions  . Cefuroxime Rash  . Clarithromycin Rash  . Erythromycin Rash  . Isradipine Rash  . Morphine Itching and Other (See Comments)    Reaction:  Makes pt feel crazy   . Penicillins Rash and Other (See Comments)    Has patient had a PCN reaction causing immediate rash, facial/tongue/throat swelling, SOB or lightheadedness with hypotension: No Has patient had a PCN reaction causing severe rash involving mucus membranes or skin necrosis: No Has patient had a PCN reaction that required hospitalization: No Has patient had a PCN reaction occurring within the last 10 years: No If all of the above answers are "NO", then may proceed with Cephalosporin use.   . Sulfonamide Derivatives Rash    REVIEW OF SYSTEMS:  Review of Systems  Unable to perform ROS: Mental status change    MEDICATIONS AT HOME:   Prior to Admission medications   Medication Sig Start Date End Date Taking? Authorizing Provider  atorvastatin (LIPITOR) 10 MG tablet Take 10 mg by mouth at bedtime.   Yes [provider]  cetirizine (ZYRTEC) 10 MG tablet Take 10 mg by mouth daily.   Yes [provider]  diltiazem (CARDIZEM CD) 120 MG 24 hr capsule Take 1 capsule (120 mg total) by mouth daily. 04/24/14  Yes Deboraha Sprang, MD  fluticasone (FLOVENT HFA) 220 MCG/ACT inhaler Inhale 1 puff into the lungs 2 (two) times daily.   Yes [provider]  Olopatadine HCl (PAZEO) 0.7 % SOLN Place 1 drop into both eyes  daily.   Yes [provider]  tamsulosin (FLOMAX) 0.4 MG CAPS capsule Take 0.4 mg by mouth daily.   Yes [provider]  venlafaxine XR (EFFEXOR-XR) 150 MG 24 hr capsule Take 150 mg by mouth at bedtime.   Yes [provider]      VITAL SIGNS:  Blood pressure 124/70, pulse 100, temperature 98.3 F (36.8 C), temperature source Oral, resp. rate (!) 24, height 5' 9"  (1.753 m), weight 83.9 kg (185 lb), SpO2 93 %.  PHYSICAL EXAMINATION:   Physical Exam  GENERAL:  71 y.o.-year-old patient lying in the bed with no acute distress. Disheveled in appearance EYES: Pupils equal, round, reactive to light and accommodation. No scleral icterus. Extraocular muscles intact.  HEENT: Head atraumatic, normocephalic. Oropharynx and nasopharynx clear.  NECK:  Supple, no jugular venous distention. No thyroid enlargement, no tenderness.  LUNGS: Normal breath sounds bilaterally, no wheezing, rales,rhonchi or crepitation. No use of accessory muscles of respiration. Decreased bibasilar breath sounds CARDIOVASCULAR: S1, S2 normal. No murmurs, rubs, or gallops.  ABDOMEN: Soft, nontender, nondistended. Right LQ colostomy bag in place, mid abdominal scar noted.  Bowel sounds present. No organomegaly or mass.  EXTREMITIES: No pedal edema, cyanosis, or clubbing.  NEUROLOGIC: Cranial nerves II through XII are intact. Muscle strength 3/5 in all extremities. Sensation intact. Gait not checked. Global weakness noted PSYCHIATRIC: The patient is alert and oriented to self.  SKIN: No obvious rash, lesion, or ulcer.   LABORATORY PANEL:   CBC  Recent Labs Lab 09/28/16 0858  WBC 23.1*  HGB 12.6*  HCT 36.4*  PLT 237   ------------------------------------------------------------------------------------------------------------------  Chemistries   Recent Labs Lab 09/28/16 0858  NA 135  K 3.7  CL 101  CO2 25  GLUCOSE 253*  BUN 12  CREATININE 0.87  CALCIUM 8.7*  AST 28  ALT 12*    ALKPHOS 65  BILITOT 1.2   ------------------------------------------------------------------------------------------------------------------  Cardiac Enzymes  Recent Labs Lab 09/28/16 0858  TROPONINI <0.03   ------------------------------------------------------------------------------------------------------------------  RADIOLOGY:  Dg Chest Port 1 View  Result Date: 09/28/2016 CLINICAL DATA:  Weakness EXAM: PORTABLE CHEST 1 VIEW COMPARISON:  02/28/2015 FINDINGS: Heart is normal size. Mild bibasilar atelectasis. No effusions or acute bony abnormality. IMPRESSION: Bibasilar atelectasis. Electronically Signed   By: Rolm Baptise M.D.   On: 09/28/2016 09:20    EKG:   Orders placed or performed during the hospital encounter of 08/23/16  . ED EKG  . ED EKG  . EKG 12-Lead  . EKG 12-Lead    IMPRESSION AND PLAN:   [name]  is a 71 y.o. male with a known history of atrial flutter status post ablation, COPD not on home oxygen, diabetes, hypertension, history of ulcerative colitis status  post ileostomy presents to hospital secondary to weakness and low-grade fevers.  #1 sepsis-with elevated white count and tachycardia on admission. -IV fluids. Urine and blood cultures. -Unknown source at this time. Though had urinary complaints, urine analysis does not show any bacteria. -Started empirically on vancomycin and meropenem at this time.  #2 history of atrial flutter, status post ablation-currently in normal sinus rhythm. Continue Cardizem.  #3 Depression- effexor  #4 BPH- on flomax  #5 DVT Prophylaxis- lovenox  Physical Therapy consult for falls    All the records are reviewed and case discussed with ED provider. Management plans discussed with the patient, family and they are in agreement.  CODE STATUS: Full Code  TOTAL TIME TAKING CARE OF THIS PATIENT: 50 minutes.    Gladstone Lighter M.D on 09/28/2016 at 2:44 PM  Between 7am to 6pm - Pager -  [phone]  After 6pm go to [email or web address] - password [email or web address] [facility]  Office  [phone]  CC: Primary care physician; Theotis Burrow, MD

## 2016-09-28 NOTE — ED Provider Notes (Signed)
[facility] Emergency Department Provider Note   ____________________________________________    I have reviewed the triage vital signs and the nursing notes.   HISTORY  Chief Complaint Weakness    HPI [name] is a 71 y.o. male who presents to the emergency department with complaints of weakness. Patient comes from a care home and reportedly has had difficulty ambulating over the last several days has become weaker and weaker. He denies fevers. Denies abdominal pain. 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 History of diabetes as well as urinary tract infections. Known decubitus ulcers as well. He does not complain of pain except in his right hip where he fell several days ago.   Past Medical History:  Diagnosis Date  . Asthma   . Atrial flutter ([place])    s/p ablation 08-2013  . COPD (chronic obstructive pulmonary disease) ([place])   . Depression   . Diabetes mellitus without complication ([place])    TYPE 2   . Hypertension   . Nonsustained ventricular tachycardia (HCC)    no clinical significance at present  . Prostate cancer ([place])   . S/P hip replacement    remote  . Ulcerative colitis ([place])    HX OF     Patient Active Problem List   Diagnosis Date Noted  . MDD (major depressive disorder), recurrent episode, moderate ([place]) 03/01/2015  . Iatrogenic hypotension 02/20/2013  . Ventricular tachycardia-nonsustained 02/20/2013  . BRONCHITIS NOT SPECIFIED AS ACUTE OR CHRONIC 10/03/2009  . DYSLIPIDEMIA 09/30/2008  . ESSENTIAL HYPERTENSION, BENIGN 09/30/2008  . Atrial flutter ([place]) 09/30/2008  . PALPITATIONS 09/30/2008    Past Surgical History:  Procedure Laterality Date  . ABLATION  09/07/2013   CTI ablation by Dr Caryl Comes  . ATRIAL FLUTTER ABLATION N/A 09/07/2013   Procedure: ATRIAL FLUTTER ABLATION;  Surgeon: Deboraha Sprang, MD;  Location: [facility] CATH LAB;  Service: Cardiovascular;  Laterality: N/A;  . BACK SURGERY    . HIP SURGERY Right   .  ILEOSTOMY    . TONSILLECTOMY      Prior to Admission medications   Medication Sig Start Date End Date Taking? Authorizing Provider  atorvastatin (LIPITOR) 10 MG tablet Take 10 mg by mouth at bedtime.   Yes [provider]  cetirizine (ZYRTEC) 10 MG tablet Take 10 mg by mouth daily.   Yes [provider]  diltiazem (CARDIZEM CD) 120 MG 24 hr capsule Take 1 capsule (120 mg total) by mouth daily. 04/24/14  Yes Deboraha Sprang, MD  fluticasone (FLOVENT HFA) 220 MCG/ACT inhaler Inhale 1 puff into the lungs 2 (two) times daily.   Yes [provider]  Olopatadine HCl (PAZEO) 0.7 % SOLN Place 1 drop into both eyes daily.   Yes [provider]  tamsulosin (FLOMAX) 0.4 MG CAPS capsule Take 0.4 mg by mouth daily.   Yes [provider]  venlafaxine XR (EFFEXOR-XR) 150 MG 24 hr capsule Take 150 mg by mouth at bedtime.   Yes [provider]     Allergies Cefuroxime; Clarithromycin; Erythromycin; Isradipine; Morphine; Penicillins; and Sulfonamide derivatives  Family History  Problem Relation Age of Onset  . Heart attack Father     Social History Social History  Substance Use Topics  . Smoking status: Former Smoker    Packs/day: 3.00    Years: 10.00    Types: Cigarettes    Quit date: 12/03/1988  . Smokeless tobacco: Never Used  . Alcohol use No    Review of  Systems  Constitutional: No fever/chills Eyes: No visual changes.  ENT: No sore throat. Cardiovascular: Denies chest pain. Respiratory: Occasional cough. Gastrointestinal: No abdominal pain.  No nausea, no vomiting.   Genitourinary: Negative for dysuria. Musculoskeletal: Negative for back pain. Skin: Negative for rash. Neurological: Negative for headaches    ____________________________________________   PHYSICAL EXAM:  VITAL SIGNS: ED Triage Vitals  Enc Vitals Group     BP 09/28/16 0900 124/70     Pulse Rate 09/28/16 0900 100     Resp 09/28/16 0900 (!) 24     Temp  09/28/16 0900 98.3 F (36.8 C)     Temp Source 09/28/16 0900 Oral     SpO2 09/28/16 0859 93 %     Weight 09/28/16 0901 83.9 kg (185 lb)     Height 09/28/16 0901 1.753 m (5' 9" )     Head Circumference --      Peak Flow --      Pain Score --      Pain Loc --      Pain Edu? --      Excl. in [place]? --     Constitutional: Alert and oriented. No acute distress. Pleasant and interactive Eyes: Conjunctivae are normal.   Nose: No congestion/rhinnorhea. Mouth/Throat: Mucous membranes are dry Neck:  Painless ROM Cardiovascular: Tachycardia. Grossly normal heart sounds.  Good peripheral circulation. Respiratory: Normal respiratory effort.  No retractions. Lungs CTAB. Gastrointestinal: Soft and nontender. No distention.  No CVA tenderness. Colostomy bag, no surrounding erythema or tenderness Genitourinary: deferred Musculoskeletal:  Warm and well perfused. Able to lift both lower extremities off the bed Neurologic:  Normal speech and language. No gross focal neurologic deficits are appreciated.  Skin:  Skin is warm, dry and intact. No rash noted. Psychiatric: Mood and affect are normal. Speech and behavior are normal.  ____________________________________________   LABS (all labs ordered are listed, but only abnormal results are displayed)  Labs Reviewed  CBC - Abnormal; Notable for the following:       Result Value   WBC 23.1 (*)    RBC 4.39 (*)    Hemoglobin 12.6 (*)    HCT 36.4 (*)    All other components within normal limits  COMPREHENSIVE METABOLIC PANEL - Abnormal; Notable for the following:    Glucose, Bld 253 (*)    Calcium 8.7 (*)    Albumin 3.1 (*)    ALT 12 (*)    All other components within normal limits  URINALYSIS, COMPLETE (UACMP) WITH MICROSCOPIC - Abnormal; Notable for the following:    Color, Urine AMBER (*)    APPearance CLOUDY (*)    Glucose, UA 150 (*)    Ketones, ur 5 (*)    Protein, ur 30 (*)    All other components within normal limits  URINE CULTURE    CULTURE, BLOOD (ROUTINE X 2)  CULTURE, BLOOD (ROUTINE X 2)  TROPONIN I  LIPASE, BLOOD  LACTIC ACID, PLASMA  LACTIC ACID, PLASMA   ____________________________________________  EKG  ED ECG REPORT I, Lavonia Drafts, the attending physician, personally viewed and interpreted this ECG.  Date: 09/28/2016  Rhythm: normal sinus rhythm QRS Axis: normal Intervals: normal ST/T Wave abnormalities: normal   ____________________________________________  RADIOLOGY Chest x-ray unremarkable ____________________________________________   PROCEDURES  Procedure(s) performed: No    Critical Care performed: No ____________________________________________   INITIAL IMPRESSION / ASSESSMENT AND PLAN / ED COURSE  Pertinent labs & imaging results that were available during my care of the patient were  reviewed by me and considered in my medical decision making (see chart for details).  Patient presents with diffuse weakness. Afebrile in the department, we will check labs including urinalysis, chest x-ray given IV fluids and reevaluate.  Patient's lab work is seen again for elevated white blood cell count, given his tachycardia and reports of fever broad-spectrum and buttocks ordered, blood cultures and lactic sent and admitted to the hospitalist for presumed sepsis    ____________________________________________   FINAL CLINICAL IMPRESSION(S) / ED DIAGNOSES  Final diagnoses:  Sepsis, due to unspecified organism [facility])      NEW MEDICATIONS STARTED DURING THIS VISIT:  New Prescriptions   No medications on file     Note:  This document was prepared using Dragon voice recognition software and may include unintentional dictation errors.    Lavonia Drafts, MD 09/28/16 [phone]

## 2016-09-28 NOTE — ED Triage Notes (Signed)
Pt from care home with generalized weakness x several months, increasing over last 2 weeks. Pt states he normally walks around on his own, has been too weak to walk x 2 weeks. EMS reports that pt has had increased HR of 150 upon arrival; they gave 500 cc bolus. HR 101 & irregular upon arrival. Pt has ostomy bag, which care home indicates has had increased output in last few days. Pt alert oriented with NAD noted.

## 2016-09-29 DIAGNOSIS — A419 Sepsis, unspecified organism: Secondary | ICD-10-CM | POA: Diagnosis not present

## 2016-09-29 LAB — GLUCOSE, CAPILLARY
GLUCOSE-CAPILLARY: 141 mg/dL — AB (ref 65–99)
GLUCOSE-CAPILLARY: 186 mg/dL — AB (ref 65–99)
GLUCOSE-CAPILLARY: 225 mg/dL — AB (ref 65–99)
Glucose-Capillary: 219 mg/dL — ABNORMAL HIGH (ref 65–99)

## 2016-09-29 LAB — URINE CULTURE: Culture: NO GROWTH

## 2016-09-29 LAB — CBC
HCT: 34.3 % — ABNORMAL LOW (ref 40.0–52.0)
Hemoglobin: 11.7 g/dL — ABNORMAL LOW (ref 13.0–18.0)
MCH: 28.4 pg (ref 26.0–34.0)
MCHC: 34 g/dL (ref 32.0–36.0)
MCV: 83.6 fL (ref 80.0–100.0)
Platelets: 229 10*3/uL (ref 150–440)
RBC: 4.1 MIL/uL — AB (ref 4.40–5.90)
RDW: 13.3 % (ref 11.5–14.5)
WBC: 21 10*3/uL — AB (ref 3.8–10.6)

## 2016-09-29 LAB — BASIC METABOLIC PANEL
Anion gap: 7 (ref 5–15)
BUN: 11 mg/dL (ref 6–20)
CALCIUM: 8.3 mg/dL — AB (ref 8.9–10.3)
CO2: 27 mmol/L (ref 22–32)
CREATININE: 0.79 mg/dL (ref 0.61–1.24)
Chloride: 102 mmol/L (ref 101–111)
GFR calc non Af Amer: >60 mL/min (ref >60)
Glucose, Bld: 162 mg/dL — ABNORMAL HIGH (ref 65–99)
Potassium: 3.5 mmol/L (ref 3.5–5.1)
SODIUM: 136 mmol/L (ref 135–145)

## 2016-09-29 LAB — MRSA PCR SCREENING: MRSA by PCR: NEGATIVE

## 2016-09-29 NOTE — Clinical Social Work Note (Signed)
Clinical Social Work Assessment  Patient Details  Name: [name] MRN: [id number] Date of Birth: 07-22-1945  Date of referral:  09/29/16               Reason for consult:  Facility Placement                Permission sought to share information with:  Facility Art therapist granted to share information::  Yes, Verbal Permission Granted  Name::        Agency::     Relationship::     Contact Information:     Housing/Transportation Living arrangements for the past 2 months:  Group Home [facility]) Source of Information:  Patient Patient Interpreter Needed:  None Criminal Activity/Legal Involvement Pertinent to Current Situation/Hospitalization:  No - Comment as needed Significant Relationships:   (cousins) Lives with:  Facility Resident Do you feel safe going back to the place where you live?  Yes Need for family participation in patient care:  No (Coment)  Care giving concerns:  Patient is a resident of [facility].   Social Worker assessment / plan:  CSW met with patient and explained role and purpose of visit. PT has recommended STR for patient. Patient tells me he has been at his group home for about 6 months and came there from [facility]. Patient is agreeable for STR but states he is not sure if it will help. Patient has medicare [employer] and will require prior auth.  Employment status:  Disabled (Comment on whether or not currently receiving Disability) Insurance information:  Managed Medicare PT Recommendations:  [facility] / Referral to community resources:     Patient/Family's Response to care:  Patient expressed appreciation for CSW assistance.   Patient/Family's Understanding of and Emotional Response to Diagnosis, Current Treatment, and Prognosis:  Patient is not sure he will improve with STR but is willing to try.  Emotional Assessment Appearance:    Attitude/Demeanor/Rapport:    (pleasant and cooperative) Affect (typically observed):  Accepting, Adaptable, Pleasant, Calm Orientation:  Oriented to Self, Oriented to Place, Oriented to  Time, Oriented to Situation Alcohol / Substance use:  Not Applicable Psych involvement (Current and /or in the community):  No (Comment)  Discharge Needs  Concerns to be addressed:  Care Coordination Readmission within the last 30 days:  No Current discharge risk:  None Barriers to Discharge:  No Barriers Identified   Shela Leff, LCSW 09/29/2016, 2:28 PM

## 2016-09-29 NOTE — Evaluation (Signed)
Physical Therapy Evaluation Patient Details Name: [name] MRN: [id number] DOB: 06-Nov-1945 Today's Date: 09/29/2016   History of Present Illness  Pt is a 71 y/o M who presented with weakness and low grade fever.  Pt has had several falls recently.  He was noted to have a sacral decubitus ulcer.  Pt admitted with sepsis with unknown source.  Pt's PMH includes ileostomy, atrial flutter, COPD, prostate cancer, hip replacement, back surgery.    Clinical Impression  Pt admitted with above diagnosis. Pt currently with functional limitations due to the deficits listed below (see PT Problem List). [name] was very pleasant and agreeable to therapy.  Due to impaired cognition, pt is unable to provide information regarding PLOF or living situation.  Per chart review it sounds like pt has been living at a group home.  Pt currently demonstrates BLE and BUE weakness and is quick to fatigue with mobility.  He requires mod assist for bed mobility and transfers.  Unable to attempt ambulation this date due to weakness and safety concerns.  Given pt's current mobility status, recommending SNF at d/c.  Pt will benefit from skilled PT to increase their independence and safety with mobility to allow discharge to the venue listed below.      Follow Up Recommendations SNF;Supervision/Assistance - 24 hour    Equipment Recommendations  Other (comment) (TBD at next venue of care)    Recommendations for Other Services       Precautions / Restrictions Precautions Precautions: Fall;Other (comment) Precaution Comments: ileostomy Restrictions Weight Bearing Restrictions: No      Mobility  Bed Mobility Overal bed mobility: Needs Assistance Bed Mobility: Supine to Sit     Supine to sit: Mod assist;HOB elevated     General bed mobility comments: Max increased time and effort with heavy use of bed rail.  Pt requires mod assist to elevate trunk.   Transfers Overall transfer level: Needs  assistance Equipment used: Rolling walker (2 wheeled) Transfers: Sit to/from [facility] Sit to Stand: Mod assist;From elevated surface Stand pivot transfers: Mod assist       General transfer comment: Mod assist to boost to standing and to remain steady with sit<>stand.  Cues for hand placement and safe technique using RW.  Pt shuffles his feet when pivoting and requires assist due to instability.   Ambulation/Gait             General Gait Details: Unable to assess at this time due to safety concerns.  Stairs            Wheelchair Mobility    Modified Rankin (Stroke Patients Only)       Balance Overall balance assessment: Needs assistance;History of Falls Sitting-balance support: Single extremity supported;Feet supported Sitting balance-Leahy Scale: Poor Sitting balance - Comments: Pt relies on at least 1UE support when sitting EOB.   Standing balance support: Bilateral upper extremity supported;During functional activity Standing balance-Leahy Scale: Poor Standing balance comment: Pt relies on BUE support for static and dynamic activities                             Pertinent Vitals/Pain Pain Assessment: Faces Faces Pain Scale: Hurts little more Pain Location: chronic low back pain Pain Descriptors / Indicators: Aching;Discomfort Pain Intervention(s): Limited activity within patient's tolerance;Monitored during session;Repositioned    Home Living Family/patient expects to be discharged to:: Group home  Additional Comments: Unsure of layout of group home as pt unable to provide information and no information found in chart    Prior Function Level of Independence: Needs assistance   Gait / Transfers Assistance Needed: Pt reports he ambulates limited distances in his room with his RW.  He reports that over the past 6 months he has had ~60 falls.    ADL's / Homemaking Assistance Needed: Pt unable to provide  information but meals likely provided at group home and pt likely required assist with ADLs.  Comments: Question reliability of information provided by pt due to cognition.       Hand Dominance        Extremity/Trunk Assessment   Upper Extremity Assessment Upper Extremity Assessment:  (BUE strength grossly 4-/5)    Lower Extremity Assessment Lower Extremity Assessment:  (BLE strength grossly 3+/5)    Cervical / Trunk Assessment Cervical / Trunk Assessment: Other exceptions Cervical / Trunk Exceptions: h/o chronic low back pain  Communication   Communication: No difficulties  Cognition Arousal/Alertness: Awake/alert Behavior During Therapy: WFL for tasks assessed/performed Overall Cognitive Status: No family/caregiver present to determine baseline cognitive functioning                                 General Comments: Pt oriented to self only.  He is unable to provide information regarding home layout or PLOF.      General Comments General comments (skin integrity, edema, etc.): RN notified that ileostomy bag leaking.     Exercises     Assessment/Plan    PT Assessment Patient needs continued PT services  PT Problem List Decreased strength;Decreased activity tolerance;Decreased balance;Decreased mobility;Decreased cognition;Decreased knowledge of use of DME;Decreased safety awareness;Pain       PT Treatment Interventions DME instruction;Gait training;Stair training;Functional mobility training;Therapeutic activities;Therapeutic exercise;Balance training;Neuromuscular re-education;Cognitive remediation;Patient/family education;Wheelchair mobility training;Modalities    PT Goals (Current goals can be found in the Care Plan section)  Acute Rehab PT Goals Patient Stated Goal: to get stronger PT Goal Formulation: With patient Time For Goal Achievement: 10/13/16 Potential to Achieve Goals: Good    Frequency Min 2X/week   Barriers to discharge Other  (comment) Unsure of pt's current living situation or amount of assist available    Co-evaluation               AM-PAC PT "6 Clicks" Daily Activity  Outcome Measure Difficulty turning over in bed (including adjusting bedclothes, sheets and blankets)?: Unable Difficulty moving from lying on back to sitting on the side of the bed? : Unable Difficulty sitting down on and standing up from a chair with arms (e.g., wheelchair, bedside commode, etc,.)?: Unable Help needed moving to and from a bed to chair (including a wheelchair)?: A Lot Help needed walking in hospital room?: A Lot Help needed climbing 3-5 steps with a railing? : Total 6 Click Score: 8    End of Session Equipment Utilized During Treatment: Gait belt Activity Tolerance: Patient limited by fatigue Patient left: in chair;with call bell/phone within reach;with chair alarm set Nurse Communication: Mobility status;Other (comment) (ileostomy bag leaking) PT Visit Diagnosis: Muscle weakness (generalized) (M62.81);History of falling (Z91.81);Repeated falls (R29.6);Unsteadiness on feet (R26.81);Difficulty in walking, not elsewhere classified (R26.2)    Time: 6237-6283 PT Time Calculation (min) (ACUTE ONLY): 24 min   Charges:   PT Evaluation $PT Eval Moderate Complexity: 1 Mod PT Treatments $Therapeutic Activity: 8-22 mins   PT G Codes:  PT G-Codes **NOT FOR INPATIENT CLASS** Functional Assessment Tool Used: AM-PAC 6 Clicks Basic Mobility;Clinical judgement Functional Limitation: Mobility: Walking and moving around Mobility: Walking and Moving Around Current Status (T0211): At least 80 percent but less than 100 percent impaired, limited or restricted Mobility: Walking and Moving Around Goal Status [phone]): At least 40 percent but less than 60 percent impaired, limited or restricted    Collie Siad PT, DPT 09/29/2016, 10:24 AM

## 2016-09-29 NOTE — Progress Notes (Signed)
Pt. is from [facility].  [phone]. The CEO is GITJLL  VDIXV  [phone].

## 2016-09-29 NOTE — NC FL2 (Signed)
[place] LEVEL OF CARE SCREENING TOOL     IDENTIFICATION  Patient Name: WIRT HEMMERICH Birthdate: [birth year] Sex: male Admission Date (Current Location): 09/28/2016  [facility] and [place] Number:  Engineering geologist and Address:  [facility], [address], [place], [place] [address]      Provider Number: [id number]  Attending Physician Name and Address:  Henreitta Leber, MD  Relative Name and Phone Number:       Current Level of Care: Hospital Recommended Level of Care: [place] Prior Approval Number:    Date Approved/Denied:   PASRR Number:    Discharge Plan: SNF    Current Diagnoses: Patient Active Problem List   Diagnosis Date Noted  . Sepsis ([place]) 09/28/2016  . MDD (major depressive disorder), recurrent episode, moderate ([place]) 03/01/2015  . Iatrogenic hypotension 02/20/2013  . Ventricular tachycardia-nonsustained 02/20/2013  . BRONCHITIS NOT SPECIFIED AS ACUTE OR CHRONIC 10/03/2009  . DYSLIPIDEMIA 09/30/2008  . ESSENTIAL HYPERTENSION, BENIGN 09/30/2008  . Atrial flutter ([place]) 09/30/2008  . PALPITATIONS 09/30/2008    Orientation RESPIRATION BLADDER Height & Weight     Self, Time, Situation, Place  Normal Incontinent Weight: 185 lb (83.9 kg) Height:  5' 9"  (175.3 cm)  BEHAVIORAL SYMPTOMS/MOOD NEUROLOGICAL BOWEL NUTRITION STATUS   (none)  (none) Incontinent Diet (regular)  AMBULATORY STATUS COMMUNICATION OF NEEDS Skin   Extensive Assist Verbally Normal, PU Stage and Appropriate Care                       Personal Care Assistance Level of Assistance  Bathing, Dressing Bathing Assistance: Limited assistance   Dressing Assistance: Limited assistance     Functional Limitations Info  Sight Sight Info: Impaired        SPECIAL CARE FACTORS FREQUENCY  PT (By licensed PT)                    Contractures Contractures Info: Not present    Additional Factors Info  Code Status Code  Status Info: full             Current Medications (09/29/2016):  This is the current hospital active medication list Current Facility-Administered Medications  Medication Dose Route Frequency Provider Last Rate Last Dose  . 0.9 %  sodium chloride infusion   Intravenous Continuous Gladstone Lighter, MD 75 mL/hr at 09/29/16 1009    . acetaminophen (TYLENOL) tablet 650 mg  650 mg Oral Q6H PRN Gladstone Lighter, MD       Or  . acetaminophen (TYLENOL) suppository 650 mg  650 mg Rectal Q6H PRN Gladstone Lighter, MD      . atorvastatin (LIPITOR) tablet 10 mg  10 mg Oral QHS Gladstone Lighter, MD   10 mg at 09/28/16 2309  . budesonide (PULMICORT) nebulizer solution 0.5 mg  2 mL Inhalation BID Gladstone Lighter, MD   0.5 mg at 09/29/16 0759  . diltiazem (CARDIZEM CD) 24 hr capsule 120 mg  120 mg Oral Daily Gladstone Lighter, MD   120 mg at 09/29/16 1009  . docusate sodium (COLACE) capsule 100 mg  100 mg Oral BID Gladstone Lighter, MD   100 mg at 09/29/16 1009  . enoxaparin (LOVENOX) injection 40 mg  40 mg Subcutaneous Q24H Gladstone Lighter, MD   40 mg at 09/28/16 2309  . insulin aspart (novoLOG) injection 0-5 Units  0-5 Units Subcutaneous QHS Gladstone Lighter, MD      . insulin aspart (novoLOG) injection 0-9 Units  0-9 Units Subcutaneous TID WC Gladstone Lighter, MD   3 Units at 09/29/16 1202  . meropenem (MERREM) 1 g in sodium chloride 0.9 % 100 mL IVPB  1 g Intravenous Q8H Hallaji, Sheema M, RPH 200 mL/hr at 09/29/16 1433 1 g at 09/29/16 1433  . olopatadine (PATANOL) 0.1 % ophthalmic solution 1 drop  1 drop Both Eyes Daily Gladstone Lighter, MD   1 drop at 09/29/16 1009  . ondansetron (ZOFRAN) tablet 4 mg  4 mg Oral Q6H PRN Gladstone Lighter, MD       Or  . ondansetron (ZOFRAN) injection 4 mg  4 mg Intravenous Q6H PRN Gladstone Lighter, MD      . polyethylene glycol (MIRALAX / GLYCOLAX) packet 17 g  17 g Oral Daily PRN Gladstone Lighter, MD      . tamsulosin (FLOMAX) capsule 0.4 mg   0.4 mg Oral Daily Gladstone Lighter, MD   0.4 mg at 09/29/16 1009  . traMADol (ULTRAM) tablet 50 mg  50 mg Oral Q6H PRN Gladstone Lighter, MD      . venlafaxine XR (EFFEXOR-XR) 24 hr capsule 150 mg  150 mg Oral QHS Gladstone Lighter, MD   150 mg at 09/28/16 2308     Discharge Medications: Please see discharge summary for a list of discharge medications.  Relevant Imaging Results:  Relevant Lab Results:   Additional Information ss: [id number]  Shela Leff, LCSW

## 2016-09-29 NOTE — Progress Notes (Signed)
[facility] [place] NAME: Arshawn Valdez    MR#:  [id number]  DATE OF BIRTH:  Jul 08, 1945  SUBJECTIVE:   Patient here due to suspected sepsis with source unclear. Leukocytosis is improved since yesterday. Afebrile, urinalysis and chest x-ray is within normal range.No other acute events or complaints presently.  REVIEW OF SYSTEMS:    Review of Systems  Constitutional: Negative for chills and fever.  HENT: Negative for congestion and tinnitus.   Eyes: Negative for blurred vision and double vision.  Respiratory: Negative for cough, shortness of breath and wheezing.   Cardiovascular: Negative for chest pain, orthopnea and PND.  Gastrointestinal: Negative for abdominal pain, diarrhea, nausea and vomiting.  Genitourinary: Negative for dysuria and hematuria.  Neurological: Negative for dizziness, sensory change and focal weakness.  All other systems reviewed and are negative.   Nutrition: Regular Tolerating Diet: Yes Tolerating PT: Eval noted.   DRUG ALLERGIES:   Allergies  Allergen Reactions  . Cefuroxime Rash  . Clarithromycin Rash  . Erythromycin Rash  . Isradipine Rash  . Morphine Itching and Other (See Comments)    Reaction:  Makes pt feel crazy   . Penicillins Rash and Other (See Comments)    Has patient had a PCN reaction causing immediate rash, facial/tongue/throat swelling, SOB or lightheadedness with hypotension: No Has patient had a PCN reaction causing severe rash involving mucus membranes or skin necrosis: No Has patient had a PCN reaction that required hospitalization: No Has patient had a PCN reaction occurring within the last 10 years: No If all of the above answers are "NO", then may proceed with Cephalosporin use.   . Sulfonamide Derivatives Rash    VITALS:  Blood pressure (!) 126/57, pulse (!) 106, temperature 98.2 F (36.8 C), temperature source Oral, resp. rate 18, height 5' 9"  (1.753 m), weight 83.9 kg (185 lb),  SpO2 96 %.  PHYSICAL EXAMINATION:   Physical Exam  GENERAL:  71 y.o.-year-old patient sitting up in chair in no acute distress.  EYES: Pupils equal, round, reactive to light and accommodation. No scleral icterus. Extraocular muscles intact.  HEENT: Head atraumatic, normocephalic. Oropharynx and nasopharynx clear.  Poor Dentition.  NECK:  Supple, no jugular venous distention. No thyroid enlargement, no tenderness.  LUNGS: Normal breath sounds bilaterally, no wheezing, rales, rhonchi. No use of accessory muscles of respiration.  CARDIOVASCULAR: S1, S2 normal. No murmurs, rubs, or gallops.  ABDOMEN: Soft, nontender, nondistended. Bowel sounds present. No organomegaly or mass.  EXTREMITIES: No cyanosis, clubbing or edema b/l.    NEUROLOGIC: Cranial nerves II through XII are intact. No focal Motor or sensory deficits b/l.   PSYCHIATRIC: The patient is alert and oriented x 3.  SKIN: No obvious rash, lesion, or ulcer.    LABORATORY PANEL:   CBC  Recent Labs Lab 09/29/16 0531  WBC 21.0*  HGB 11.7*  HCT 34.3*  PLT 229   ------------------------------------------------------------------------------------------------------------------  Chemistries   Recent Labs Lab 09/28/16 0858 09/29/16 0531  NA 135 136  K 3.7 3.5  CL 101 102  CO2 25 27  GLUCOSE 253* 162*  BUN 12 11  CREATININE 0.87 0.79  CALCIUM 8.7* 8.3*  AST 28  --   ALT 12*  --   ALKPHOS 65  --   BILITOT 1.2  --    ------------------------------------------------------------------------------------------------------------------  Cardiac Enzymes  Recent Labs Lab 09/28/16 0858  TROPONINI <0.03   ------------------------------------------------------------------------------------------------------------------  RADIOLOGY:  Dg Chest Port 1 View  Result Date: 09/28/2016 CLINICAL  DATA:  Weakness EXAM: PORTABLE CHEST 1 VIEW COMPARISON:  02/28/2015 FINDINGS: Heart is normal size. Mild bibasilar atelectasis. No  effusions or acute bony abnormality. IMPRESSION: Bibasilar atelectasis. Electronically Signed   By: Rolm Baptise M.D.   On: 09/28/2016 09:20     ASSESSMENT AND PLAN:   71 year old male with past medical history of ulcerative colitis status post ileostomy,prostate cancer, hypertension, diabetes, COPD, depression, history of atrial flutter who presented to the hospital due to frequent falls and weakness and noted to have leukocytosis with tachycardia and suspected sepsis.  1. Sepsis this was a suspected diagnosis given patient's leukocytosis and tachycardia.  - Source of the sepsis still remains unclear.his urinalysis is negative, chest x-ray is negative for acute pathology. Patient's decubitus ulcer is unlikely to be infected. -Patient's MRSA PCR is negative we'll DC vancomycin. Continue IV meropenem empirically for now.White cell count is trending down. If remains afebrile and white cell count coming down with narrow antibiotics to oral tomorrow.  2. DM - cont. SSI and follow BS.   3. Hx of A. Flutter - rate controlled.  - cont. Cardizem.   4. Hyperlipidemia - cont. Atorvastatin  5. BPH - cont. Flomax.   6. Depression - cont. Effexor.   Seen by PT and they recommend SNF/STR and will make social work aware.    All the records are reviewed and case discussed with Care Management/Social Worker. Management plans discussed with the patient, family and they are in agreement.  CODE STATUS: Full code  DVT Prophylaxis: Lovenox  TOTAL TIME TAKING CARE OF THIS PATIENT: 30 minutes.   POSSIBLE D/C IN 1-2 DAYS, DEPENDING ON CLINICAL CONDITION.   Henreitta Leber M.D on 09/29/2016 at 2:05 PM  Between 7am to 6pm - Pager - [phone]  After 6pm go to [email or web address] - Proofreader  [facility]  Office  [phone]  CC: Primary care physician; Theotis Burrow, MD

## 2016-09-30 DIAGNOSIS — A419 Sepsis, unspecified organism: Secondary | ICD-10-CM | POA: Diagnosis not present

## 2016-09-30 LAB — CBC
HEMATOCRIT: 32.9 % — AB (ref 40.0–52.0)
HEMOGLOBIN: 11.3 g/dL — AB (ref 13.0–18.0)
MCH: 29 pg (ref 26.0–34.0)
MCHC: 34.4 g/dL (ref 32.0–36.0)
MCV: 84.3 fL (ref 80.0–100.0)
Platelets: 233 10*3/uL (ref 150–440)
RBC: 3.9 MIL/uL — AB (ref 4.40–5.90)
RDW: 13.3 % (ref 11.5–14.5)
WBC: 18.4 10*3/uL — AB (ref 3.8–10.6)

## 2016-09-30 LAB — GLUCOSE, CAPILLARY
Glucose-Capillary: 135 mg/dL — ABNORMAL HIGH (ref 65–99)
Glucose-Capillary: 234 mg/dL — ABNORMAL HIGH (ref 65–99)

## 2016-09-30 MED ORDER — LEVOFLOXACIN 500 MG PO TABS: 500.0000 mg | ORAL_TABLET | Freq: Every day | ORAL | 0 refills | Status: AC

## 2016-09-30 MED ORDER — INSULIN ASPART 100 UNIT/ML ~~LOC~~ SOLN: 3.0000 [IU] | Freq: Three times a day (TID) | SUBCUTANEOUS | 11 refills | Status: DC

## 2016-09-30 MED ORDER — INSULIN ASPART 100 UNIT/ML ~~LOC~~ SOLN: 3.0000 [IU] | Freq: Three times a day (TID) | SUBCUTANEOUS | Status: DC

## 2016-09-30 MED ORDER — TRAMADOL HCL 50 MG PO TABS: 50.0000 mg | ORAL_TABLET | Freq: Four times a day (QID) | ORAL | 0 refills | Status: DC | PRN

## 2016-09-30 NOTE — Clinical Social Work Note (Signed)
Patient has accepted [facility] from bed offers. [place] has provided prior auth for patient to go to [facility]: [phone]. Currently awaiting pasrr. Shela Leff MSW,LCSW [phone]

## 2016-09-30 NOTE — Discharge Summary (Signed)
[name] NAME: [name]    MR#:  [id number]  DATE OF BIRTH:  January 02, 1946  DATE OF ADMISSION:  09/28/2016 ADMITTING PHYSICIAN: Gladstone Lighter, MD  DATE OF DISCHARGE: 09/30/2016  PRIMARY CARE PHYSICIAN: Theotis Burrow, MD    ADMISSION DIAGNOSIS:  Sepsis, due to unspecified organism ([employer]) [A41.9]  DISCHARGE DIAGNOSIS:  Active Problems:   Sepsis ([place])   SECONDARY DIAGNOSIS:   Past Medical History:  Diagnosis Date  . Asthma   . Atrial flutter ([place])    s/p ablation 08-2013  . COPD (chronic obstructive pulmonary disease) ([place])   . Depression   . Diabetes mellitus without complication ([place])    TYPE 2   . Hypertension   . Nonsustained ventricular tachycardia (HCC)    no clinical significance at present  . Prostate cancer ([place])   . S/P hip replacement    remote  . Ulcerative colitis ([place])    HX OF     HOSPITAL COURSE:   71 year old male with past medical history of ulcerative colitis status post ileostomy,prostate cancer, hypertension, diabetes, COPD, depression, history of atrial flutter who presented to the hospital due to frequent falls and weakness and noted to have leukocytosis with tachycardia and suspected sepsis.  1. Sepsis - this was a suspected diagnosis given patient's leukocytosis and tachycardia.  - Source of the sepsis still remains unclear. His urinalysis is negative, chest x-ray is negative for acute pathology. Patient's decubitus ulcer is unlikely to be infected. - initially pt. Was treated with IV Vanc, Meropenem and just narrowed to Meropenem and now being discharged on Oral Levaquin for 5 more days.  Source of sepsis still remains unclear. He is afebrile, his WBC count has trended down and he is hemodynamically stable and therefore being discharged.   2. DM - while in the hospital pt. Was on SSI but is being discharged on low dose Novolog with meals.  - further changes to his DM regimen  can be done as outpatient. Will place on Carb control diet.   3. Hx of A. Flutter - rate controlled.  - cont. Cardizem.   4. Hyperlipidemia - pt. Will cont. Atorvastatin  5. BPH - pt. Will cont. Flomax.   6. Depression - pt. Will cont. Effexor.   DISCHARGE CONDITIONS:   Stable.   CONSULTS OBTAINED:    DRUG ALLERGIES:   Allergies  Allergen Reactions  . Cefuroxime Rash  . Clarithromycin Rash  . Erythromycin Rash  . Isradipine Rash  . Morphine Itching and Other (See Comments)    Reaction:  Makes pt feel crazy   . Penicillins Rash and Other (See Comments)    Has patient had a PCN reaction causing immediate rash, facial/tongue/throat swelling, SOB or lightheadedness with hypotension: No Has patient had a PCN reaction causing severe rash involving mucus membranes or skin necrosis: No Has patient had a PCN reaction that required hospitalization: No Has patient had a PCN reaction occurring within the last 10 years: No If all of the above answers are "NO", then may proceed with Cephalosporin use.   . Sulfonamide Derivatives Rash    DISCHARGE MEDICATIONS:   Allergies as of 09/30/2016      Reactions   Cefuroxime Rash   Clarithromycin Rash   Erythromycin Rash   Isradipine Rash   Morphine Itching, Other (See Comments)   Reaction:  Makes pt feel crazy    Penicillins Rash, Other (See Comments)   Has patient had a PCN  reaction causing immediate rash, facial/tongue/throat swelling, SOB or lightheadedness with hypotension: No Has patient had a PCN reaction causing severe rash involving mucus membranes or skin necrosis: No Has patient had a PCN reaction that required hospitalization: No Has patient had a PCN reaction occurring within the last 10 years: No If all of the above answers are "NO", then may proceed with Cephalosporin use.   Sulfonamide Derivatives Rash      Medication List    TAKE these medications   atorvastatin 10 MG tablet Commonly known as:  LIPITOR Take  10 mg by mouth at bedtime.   cetirizine 10 MG tablet Commonly known as:  ZYRTEC Take 10 mg by mouth daily.   diltiazem 120 MG 24 hr capsule Commonly known as:  CARDIZEM CD Take 1 capsule (120 mg total) by mouth daily.   fluticasone 220 MCG/ACT inhaler Commonly known as:  FLOVENT HFA Inhale 1 puff into the lungs 2 (two) times daily.   insulin aspart 100 UNIT/ML injection Commonly known as:  novoLOG Inject 3 Units into the skin 3 (three) times daily with meals.   levofloxacin 500 MG tablet Commonly known as:  LEVAQUIN Take 1 tablet (500 mg total) by mouth daily.   PAZEO 0.7 % Soln Generic drug:  Olopatadine HCl Place 1 drop into both eyes daily.   tamsulosin 0.4 MG Caps capsule Commonly known as:  FLOMAX Take 0.4 mg by mouth daily.   traMADol 50 MG tablet Commonly known as:  ULTRAM Take 1 tablet (50 mg total) by mouth every 6 (six) hours as needed for moderate pain or severe pain.   venlafaxine XR 150 MG 24 hr capsule Commonly known as:  EFFEXOR-XR Take 150 mg by mouth at bedtime.            Discharge Care Instructions        Start     Ordered   09/30/16 0000  traMADol (ULTRAM) 50 MG tablet  Every 6 hours PRN     09/30/16 1341   09/30/16 0000  levofloxacin (LEVAQUIN) 500 MG tablet  Daily    Comments:  Stop date 10/05/16   09/30/16 1341   09/30/16 0000  Activity as tolerated - No restrictions     09/30/16 1341   09/30/16 0000  Diet - low sodium heart healthy     09/30/16 1341   09/30/16 0000  insulin aspart (NOVOLOG) 100 UNIT/ML injection  3 times daily with meals     09/30/16 1344        DISCHARGE INSTRUCTIONS:   DIET:  Cardiac diet and Diabetic diet  DISCHARGE CONDITION:  Stable  ACTIVITY:  Activity as tolerated  OXYGEN:  Home Oxygen: No.   Oxygen Delivery: room air  DISCHARGE LOCATION:  nursing home   If you experience worsening of your admission symptoms, develop shortness of breath, life threatening emergency, suicidal or homicidal  thoughts you must seek medical attention immediately by calling 911 or calling your MD immediately  if symptoms less severe.  You Must read complete instructions/literature along with all the possible adverse reactions/side effects for all the Medicines you take and that have been prescribed to you. Take any new Medicines after you have completely understood and accpet all the possible adverse reactions/side effects.   Please note  You were cared for by a hospitalist during your hospital stay. If you have any questions about your discharge medications or the care you received while you were in the hospital after you are discharged, you can call  the unit and asked to speak with the hospitalist on call if the hospitalist that took care of you is not available. Once you are discharged, your primary care physician will handle any further medical issues. Please note that NO REFILLS for any discharge medications will be authorized once you are discharged, as it is imperative that you return to your primary care physician (or establish a relationship with a primary care physician if you do not have one) for your aftercare needs so that they can reassess your need for medications and monitor your lab values.     Today   Afebrile overnight.  Hemodynamically stable. WBC count trending down.  BC remain (-). Feels fine and will d/c to SNF today.   VITAL SIGNS:  Blood pressure (!) 116/52, pulse 82, temperature 98 F (36.7 C), temperature source Oral, resp. rate 16, height 5' 9"  (1.753 m), weight 83.9 kg (185 lb), SpO2 96 %.  I/O:    Intake/Output Summary (Last 24 hours) at 09/30/16 1344 Last data filed at 09/30/16 1028  Gross per 24 hour  Intake             1004 ml  Output              575 ml  Net              429 ml    PHYSICAL EXAMINATION:   GENERAL:  71 y.o.-year-old patient sitting up in chair in no acute distress.  EYES: Pupils equal, round, reactive to light and accommodation. No scleral  icterus. Extraocular muscles intact.  HEENT: Head atraumatic, normocephalic. Oropharynx and nasopharynx clear.  Poor Dentition.  NECK:  Supple, no jugular venous distention. No thyroid enlargement, no tenderness.  LUNGS: Normal breath sounds bilaterally, no wheezing, rales, rhonchi. No use of accessory muscles of respiration.  CARDIOVASCULAR: S1, S2 normal. No murmurs, rubs, or gallops.  ABDOMEN: Soft, nontender, nondistended. Bowel sounds present. No organomegaly or mass. + ileostomy in place.   EXTREMITIES: No cyanosis, clubbing or edema b/l.    NEUROLOGIC: Cranial nerves II through XII are intact. No focal Motor or sensory deficits b/l. Globally weak.  PSYCHIATRIC: The patient is alert and oriented x 3.  SKIN: No obvious rash, lesion, or ulcer.    DATA REVIEW:   CBC  Recent Labs Lab 09/30/16 0358  WBC 18.4*  HGB 11.3*  HCT 32.9*  PLT 233    Chemistries   Recent Labs Lab 09/28/16 0858 09/29/16 0531  NA 135 136  K 3.7 3.5  CL 101 102  CO2 25 27  GLUCOSE 253* 162*  BUN 12 11  CREATININE 0.87 0.79  CALCIUM 8.7* 8.3*  AST 28  --   ALT 12*  --   ALKPHOS 65  --   BILITOT 1.2  --     Cardiac Enzymes  Recent Labs Lab 09/28/16 0858  TROPONINI <0.03    Microbiology Results  Results for orders placed or performed during the hospital encounter of 09/28/16  Urine culture     Status: None   Collection Time: 09/28/16  8:58 AM  Result Value Ref Range Status   Specimen Description URINE, RANDOM  Final   Special Requests NONE  Final   Culture   Final    NO GROWTH Performed at [facility], [place] [address], [place], [place] [address]    Report Status 09/29/2016 FINAL  Final  Blood Culture (routine x 2)     Status: None (Preliminary result)  Collection Time: 09/28/16  9:58 AM  Result Value Ref Range Status   Specimen Description BLOOD  Final   Special Requests Blood Culture adequate volume  Final   Culture NO GROWTH 2 DAYS  Final   Report Status PENDING   Incomplete  Blood Culture (routine x 2)     Status: None (Preliminary result)   Collection Time: 09/28/16  9:58 AM  Result Value Ref Range Status   Specimen Description BLOOD  Final   Special Requests Blood Culture adequate volume  Final   Culture NO GROWTH 2 DAYS  Final   Report Status PENDING  Incomplete  MRSA PCR Screening     Status: None   Collection Time: 09/29/16 11:58 AM  Result Value Ref Range Status   MRSA by PCR NEGATIVE NEGATIVE Final    Comment:        The GeneXpert MRSA Assay (FDA approved for NASAL specimens only), is one component of a comprehensive MRSA colonization surveillance program. It is not intended to diagnose MRSA infection nor to guide or monitor treatment for MRSA infections.     RADIOLOGY:  No results found.    Management plans discussed with the patient, family and they are in agreement.  CODE STATUS:     Code Status Orders        Start     Ordered   09/28/16 1736  Full code  Continuous     09/28/16 1735    Code Status History    Date Active Date Inactive Code Status Order ID Comments User Context   09/07/2013 11:02 AM 09/08/2013  2:22 PM Full Code [id number]  Deboraha Sprang, MD Inpatient      TOTAL TIME TAKING CARE OF THIS PATIENT: 40 minutes.    Henreitta Leber M.D on 09/30/2016 at 1:44 PM  Between 7am to 6pm - Pager - [phone]  After 6pm go to [email or web address] - Proofreader  [facility]  Office  [phone]  CC: Primary care physician; Theotis Burrow, MD

## 2016-09-30 NOTE — Clinical Social Work Placement (Signed)
   CLINICAL SOCIAL WORK PLACEMENT  NOTE  Date:  09/30/2016  Patient Details  Name: [name] MRN: [id number] Date of Birth: 1945/04/03  Clinical Social Work is seeking post-discharge placement for this patient at the [place] level of care (*CSW will initial, date and re-position this form in  chart as items are completed):  Yes   Patient/family provided with [place] Work Department's list of facilities offering this level of care within the geographic area requested by the patient (or if unable, by the patient's family).  Yes   Patient/family informed of their freedom to choose among providers that offer the needed level of care, that participate in Medicare, Medicaid or managed care program needed by the patient, have an available bed and are willing to accept the patient.  Yes   Patient/family informed of [place]'s ownership interest in [facility] and [facility], as well as of the fact that they are under no obligation to receive care at these facilities.  PASRR submitted to EDS on 09/29/16     PASRR number received on 09/30/16     Existing PASRR number confirmed on       FL2 transmitted to all facilities in geographic area requested by pt/family on       FL2 transmitted to all facilities within larger geographic area on       Patient informed that his/her managed care company has contracts with or will negotiate with certain facilities, including the following:        Yes   Patient/family informed of bed offers received.  Patient chooses bed at  [facility])     Physician recommends and patient chooses bed at  [facility])    Patient to be transferred to  ([facility]) on 09/30/16.  Patient to be transferred to facility by  (EMS)     Patient family notified on 09/30/16 of transfer.  Name of family member notified:   (patient stated he would notify )     PHYSICIAN       Additional Comment:     _______________________________________________ Shela Leff, LCSW 09/30/2016, 3:15 PM

## 2016-09-30 NOTE — Progress Notes (Signed)
Pt d/c to [facility] today.  IV removed intact. Report called.  EMS called for transport.

## 2016-09-30 NOTE — Progress Notes (Signed)
Inpatient Diabetes Program Recommendations  AACE/ADA: New Consensus Statement on Inpatient Glycemic Control (2015)  Target Ranges:  Prepandial:   less than 140 mg/dL      [facility] postprandial:   less than 180 mg/dL (1-2 hours)      Critically ill patients:  140 - 180 mg/dL   Results for RAJVEER, HANDLER (MRN [id number]) as of 09/30/2016 12:47  Ref. Range 09/29/2016 07:32 09/29/2016 11:44 09/29/2016 16:51 09/29/2016 21:19 09/30/2016 07:43 09/30/2016 11:36  Glucose-Capillary Latest Ref Range: 65 - 99 mg/dL 141 (H) 219 (H) 186 (H) 225 (H) 135 (H) 234 (H)   Review of Glycemic Control  Diabetes history: DM2 Outpatient Diabetes medications: None Current orders for Inpatient glycemic control: Novolog 0-9 units TID with meals, Novolog 0-5 units QHS  Inpatient Diabetes Program Recommendations:  Insulin - Meal Coverage: While inpatient, please consider ordering Novolog 3 units TID wtih meals for meal coverage if patient eats at least 50% of meals. Diet: Please discontinue Regular diet and order Carb Modified diet.  Thanks, Barnie Alderman, RN, MSN, CDE Diabetes Coordinator Inpatient Diabetes Program [phone] (Team Pager from 8am to 5pm)

## 2016-09-30 NOTE — Clinical Social Work Note (Signed)
Pasrr obtained and patient to go to [facility]. Discharge information sent. Patient is in agreement. Shela Leff MSW,LCSW [phone]

## 2016-10-03 LAB — CULTURE, BLOOD (ROUTINE X 2)
CULTURE: NO GROWTH
Culture: NO GROWTH
Special Requests: ADEQUATE
Special Requests: ADEQUATE

## 2016-10-29 ENCOUNTER — Emergency Department (HOSPITAL_COMMUNITY)
Admission: EM | Admit: 2016-10-29 | Discharge: 2016-10-29 | Disposition: A | Discharge status: Home / Self Care | Payer: Medicare HMO | Attending: Emergency Medicine | Admitting: Emergency Medicine

## 2016-10-29 ENCOUNTER — Encounter (HOSPITAL_COMMUNITY): Payer: Self-pay

## 2016-10-29 DIAGNOSIS — Z8546 Personal history of malignant neoplasm of prostate: Secondary | ICD-10-CM | POA: Diagnosis not present

## 2016-10-29 DIAGNOSIS — Z87891 Personal history of nicotine dependence: Secondary | ICD-10-CM | POA: Insufficient documentation

## 2016-10-29 DIAGNOSIS — Z79899 Other long term (current) drug therapy: Secondary | ICD-10-CM | POA: Diagnosis not present

## 2016-10-29 DIAGNOSIS — Z433 Encounter for attention to colostomy: Secondary | ICD-10-CM | POA: Insufficient documentation

## 2016-10-29 DIAGNOSIS — E119 Type 2 diabetes mellitus without complications: Secondary | ICD-10-CM | POA: Insufficient documentation

## 2016-10-29 DIAGNOSIS — I10 Essential (primary) hypertension: Secondary | ICD-10-CM | POA: Insufficient documentation

## 2016-10-29 DIAGNOSIS — J449 Chronic obstructive pulmonary disease, unspecified: Secondary | ICD-10-CM | POA: Diagnosis not present

## 2016-10-29 DIAGNOSIS — K9400 Colostomy complication, unspecified: Secondary | ICD-10-CM | POA: Diagnosis present

## 2016-10-29 DIAGNOSIS — Z794 Long term (current) use of insulin: Secondary | ICD-10-CM | POA: Diagnosis not present

## 2016-10-29 DIAGNOSIS — J45909 Unspecified asthma, uncomplicated: Secondary | ICD-10-CM | POA: Insufficient documentation

## 2016-10-29 NOTE — ED Triage Notes (Signed)
Pt resides at [facility] x 1 week.  Staff report pt is needing a new colostomy bag which they did not have at the facility.  Pt has redness around the stoma site, tender to the touch.

## 2016-10-29 NOTE — ED Provider Notes (Signed)
[facility] DEPT Provider Note   CSN: [id number] Arrival date & time: 10/29/16  5277     History   Chief Complaint Chief Complaint  Patient presents with  . Needs colostomy bag    HPI [name] is a 71 y.o. male presenting from a local assisted living facility with a history of ulcerative colitis and long-standing history of colostomy presents this evening for placement of a colostomy bag.  He is a new resident at a local facility and apparently he did not have additional colostomy bag at the home, discovered after the disposed of the used to colostomy bag and presents for assistance with this problem.  He denies any symptoms at this time.  The history is provided by the patient.    Past Medical History:  Diagnosis Date  . Asthma   . Atrial flutter ([place])    s/p ablation 08-2013  . COPD (chronic obstructive pulmonary disease) ([place])   . Depression   . Diabetes mellitus without complication ([place])    TYPE 2   . Hypertension   . Nonsustained ventricular tachycardia (HCC)    no clinical significance at present  . Prostate cancer ([place])   . S/P hip replacement    remote  . Ulcerative colitis ([place])    HX OF     Patient Active Problem List   Diagnosis Date Noted  . Sepsis ([place]) 09/28/2016  . MDD (major depressive disorder), recurrent episode, moderate ([place]) 03/01/2015  . Iatrogenic hypotension 02/20/2013  . Ventricular tachycardia-nonsustained 02/20/2013  . BRONCHITIS NOT SPECIFIED AS ACUTE OR CHRONIC 10/03/2009  . DYSLIPIDEMIA 09/30/2008  . ESSENTIAL HYPERTENSION, BENIGN 09/30/2008  . Atrial flutter ([place]) 09/30/2008  . PALPITATIONS 09/30/2008    Past Surgical History:  Procedure Laterality Date  . ABLATION  09/07/2013   CTI ablation by Dr Caryl Comes  . ATRIAL FLUTTER ABLATION N/A 09/07/2013   Procedure: ATRIAL FLUTTER ABLATION;  Surgeon: Deboraha Sprang, MD;  Location: [facility] CATH LAB;  Service: Cardiovascular;  Laterality: N/A;  . BACK SURGERY    . HIP SURGERY Right     . ILEOSTOMY    . TONSILLECTOMY         Home Medications    Prior to Admission medications   Medication Sig Start Date End Date Taking? Authorizing Provider  cetirizine (ZYRTEC) 10 MG tablet Take 10 mg by mouth daily.   Yes [provider]  Olopatadine HCl (PAZEO) 0.7 % SOLN Place 1 drop into both eyes daily.   Yes [provider]  traMADol (ULTRAM) 50 MG tablet Take 1 tablet (50 mg total) by mouth every 6 (six) hours as needed for moderate pain or severe pain. 09/30/16  Yes Henreitta Leber, MD  atorvastatin (LIPITOR) 10 MG tablet Take 10 mg by mouth at bedtime.    [provider]  diltiazem (CARDIZEM CD) 120 MG 24 hr capsule Take 1 capsule (120 mg total) by mouth daily. 04/24/14   Deboraha Sprang, MD  fluticasone (FLOVENT HFA) 220 MCG/ACT inhaler Inhale 1 puff into the lungs 3 (three) times daily.     [provider]  insulin aspart (NOVOLOG) 100 UNIT/ML injection Inject 3 Units into the skin 3 (three) times daily with meals. 09/30/16   Henreitta Leber, MD  tamsulosin (FLOMAX) 0.4 MG CAPS capsule Take 0.4 mg by mouth daily.    [provider]  venlafaxine XR (EFFEXOR-XR) 150 MG 24 hr capsule Take 150 mg by mouth at bedtime.    [provider]  Family History Family History  Problem Relation Age of Onset  . Heart attack Father     Social History Social History  Substance Use Topics  . Smoking status: Former Smoker    Packs/day: 3.00    Years: 10.00    Types: Cigarettes    Quit date: 12/03/1988  . Smokeless tobacco: Never Used  . Alcohol use No     Allergies   Cefuroxime; Clarithromycin; Erythromycin; Isradipine; Morphine; Penicillins; and Sulfonamide derivatives   Review of Systems Review of Systems  Constitutional: Negative for fever.  HENT: Negative for congestion and sore throat.   Eyes: Negative.   Respiratory: Negative for chest tightness and shortness of breath.   Cardiovascular: Negative for chest pain.   Gastrointestinal: Negative for abdominal pain and nausea.  Genitourinary: Negative.   Musculoskeletal: Negative for arthralgias, joint swelling and neck pain.  Skin: Negative.  Negative for rash and wound.  Neurological: Negative for dizziness, weakness, light-headedness, numbness and headaches.  Psychiatric/Behavioral: Negative.      Physical Exam Updated Vital Signs BP (!) 148/66 (BP Location: Left Arm)   Pulse 93   Temp 98.4 F (36.9 C) (Oral)   Resp 12   Ht 5' 9.5" (1.765 m)   Wt 83.9 kg (185 lb)   SpO2 98%   BMI 26.93 kg/m   Physical Exam  Constitutional: He appears well-developed and well-nourished.  HENT:  Head: Normocephalic and atraumatic.  Cardiovascular: Normal rate.   Pulmonary/Chest: Effort normal.  Abdominal: Soft. Bowel sounds are normal. There is no tenderness.  Well appearing stoma.  Musculoskeletal: Normal range of motion.  Neurological: He is alert.  Skin: Skin is warm and dry.  Psychiatric: He has a normal mood and affect.  Nursing note and vitals reviewed.    ED Treatments / Results  Labs (all labs ordered are listed, but only abnormal results are displayed) Labs Reviewed - No data to display  EKG  EKG Interpretation None       Radiology No results found.  Procedures Procedures (including critical care time)  Medications Ordered in ED Medications - No data to display   Initial Impression / Assessment and Plan / ED Course  I have reviewed the triage vital signs and the nursing notes.  Pertinent labs & imaging results that were available during my care of the patient were reviewed by me and considered in my medical decision making (see chart for details).     Colostomy replaced, prn f/u anticipated.  Final Clinical Impressions(s) / ED Diagnoses   Final diagnoses:  Colostomy complication [facility])    New Prescriptions New Prescriptions   No medications on file     Landis Martins 10/29/16 2055    Julianne Rice,  MD 11/02/16 [phone]

## 2017-01-25 ENCOUNTER — Encounter: Payer: Self-pay | Admitting: Gastroenterology

## 2017-02-04 ENCOUNTER — Encounter: Payer: Self-pay | Admitting: Internal Medicine

## 2017-03-08 ENCOUNTER — Emergency Department (HOSPITAL_COMMUNITY)
Admission: EM | Admit: 2017-03-08 | Discharge: 2017-03-09 | Disposition: A | Discharge status: Home / Self Care | Payer: Medicare HMO | Attending: Emergency Medicine | Admitting: Emergency Medicine

## 2017-03-08 ENCOUNTER — Encounter (HOSPITAL_COMMUNITY): Payer: Self-pay | Admitting: *Deleted

## 2017-03-08 ENCOUNTER — Other Ambulatory Visit: Payer: Self-pay

## 2017-03-08 DIAGNOSIS — E119 Type 2 diabetes mellitus without complications: Secondary | ICD-10-CM | POA: Insufficient documentation

## 2017-03-08 DIAGNOSIS — R4182 Altered mental status, unspecified: Secondary | ICD-10-CM | POA: Diagnosis not present

## 2017-03-08 DIAGNOSIS — I10 Essential (primary) hypertension: Secondary | ICD-10-CM | POA: Diagnosis not present

## 2017-03-08 DIAGNOSIS — N390 Urinary tract infection, site not specified: Secondary | ICD-10-CM | POA: Diagnosis not present

## 2017-03-08 DIAGNOSIS — Z79899 Other long term (current) drug therapy: Secondary | ICD-10-CM | POA: Diagnosis not present

## 2017-03-08 DIAGNOSIS — Z87891 Personal history of nicotine dependence: Secondary | ICD-10-CM | POA: Diagnosis not present

## 2017-03-08 DIAGNOSIS — Z96641 Presence of right artificial hip joint: Secondary | ICD-10-CM | POA: Diagnosis not present

## 2017-03-08 DIAGNOSIS — J449 Chronic obstructive pulmonary disease, unspecified: Secondary | ICD-10-CM | POA: Insufficient documentation

## 2017-03-08 DIAGNOSIS — Z8546 Personal history of malignant neoplasm of prostate: Secondary | ICD-10-CM | POA: Diagnosis not present

## 2017-03-08 DIAGNOSIS — Z794 Long term (current) use of insulin: Secondary | ICD-10-CM | POA: Diagnosis not present

## 2017-03-08 DIAGNOSIS — R109 Unspecified abdominal pain: Secondary | ICD-10-CM | POA: Diagnosis present

## 2017-03-08 LAB — CBC WITH DIFFERENTIAL/PLATELET
BASOS ABS: 0 10*3/uL (ref 0.0–0.1)
Basophils Relative: 0 %
EOS PCT: 2 %
Eosinophils Absolute: 0.1 10*3/uL (ref 0.0–0.7)
HCT: 33.8 % — ABNORMAL LOW (ref 39.0–52.0)
Hemoglobin: 10.7 g/dL — ABNORMAL LOW (ref 13.0–17.0)
LYMPHS PCT: 10 %
Lymphs Abs: 0.6 10*3/uL — ABNORMAL LOW (ref 0.7–4.0)
MCH: 27.5 pg (ref 26.0–34.0)
MCHC: 31.7 g/dL (ref 30.0–36.0)
MCV: 86.9 fL (ref 78.0–100.0)
Monocytes Absolute: 0.5 10*3/uL (ref 0.1–1.0)
Monocytes Relative: 8 %
NEUTROS PCT: 80 %
Neutro Abs: 4.5 10*3/uL (ref 1.7–7.7)
PLATELETS: 162 10*3/uL (ref 150–400)
RBC: 3.89 MIL/uL — AB (ref 4.22–5.81)
RDW: 13.4 % (ref 11.5–15.5)
WBC: 5.6 10*3/uL (ref 4.0–10.5)

## 2017-03-08 LAB — I-STAT CG4 LACTIC ACID, ED: Lactic Acid, Venous: 1.25 mmol/L (ref 0.5–1.9)

## 2017-03-08 NOTE — ED Triage Notes (Signed)
Pt c/o abdominal pain and bilateral leg pain x 2 weeks; pt was seen here last night for same complaint

## 2017-03-09 ENCOUNTER — Emergency Department (HOSPITAL_COMMUNITY): Payer: Medicare HMO

## 2017-03-09 LAB — COMPREHENSIVE METABOLIC PANEL
ALT: 24 U/L (ref 17–63)
AST: 41 U/L (ref 15–41)
Albumin: 3 g/dL — ABNORMAL LOW (ref 3.5–5.0)
Alkaline Phosphatase: 105 U/L (ref 38–126)
Anion gap: 10 (ref 5–15)
BUN: 13 mg/dL (ref 6–20)
CHLORIDE: 103 mmol/L (ref 101–111)
CO2: 26 mmol/L (ref 22–32)
CREATININE: 0.73 mg/dL (ref 0.61–1.24)
Calcium: 8.7 mg/dL — ABNORMAL LOW (ref 8.9–10.3)
GFR calc Af Amer: >60 mL/min (ref >60)
Glucose, Bld: 172 mg/dL — ABNORMAL HIGH (ref 65–99)
Potassium: 3.2 mmol/L — ABNORMAL LOW (ref 3.5–5.1)
Sodium: 139 mmol/L (ref 135–145)
Total Bilirubin: 0.5 mg/dL (ref 0.3–1.2)
Total Protein: 6.2 g/dL — ABNORMAL LOW (ref 6.5–8.1)

## 2017-03-09 LAB — URINALYSIS, ROUTINE W REFLEX MICROSCOPIC
BILIRUBIN URINE: NEGATIVE
Glucose, UA: NEGATIVE mg/dL
Hgb urine dipstick: NEGATIVE
KETONES UR: NEGATIVE mg/dL
Nitrite: NEGATIVE
PROTEIN: 30 mg/dL — AB
Specific Gravity, Urine: 1.024 (ref 1.005–1.030)
pH: 5 (ref 5.0–8.0)

## 2017-03-09 LAB — LIPASE, BLOOD: LIPASE: 25 U/L (ref 11–51)

## 2017-03-09 LAB — INFLUENZA PANEL BY PCR (TYPE A & B)
INFLAPCR: NEGATIVE
INFLBPCR: NEGATIVE

## 2017-03-09 MED ORDER — CIPROFLOXACIN HCL 500 MG PO TABS: 500.0000 mg | ORAL_TABLET | Freq: Two times a day (BID) | ORAL | 0 refills | Status: DC

## 2017-03-09 MED ORDER — SODIUM CHLORIDE 0.9 % IV BOLUS (SEPSIS)
1000.0000 mL | Freq: Once | INTRAVENOUS | Status: AC
  Administered 2017-03-09: 1000 mL via INTRAVENOUS

## 2017-03-09 MED ORDER — IOPAMIDOL (ISOVUE-300) INJECTION 61%
100.0000 mL | Freq: Once | INTRAVENOUS | Status: AC | PRN
  Administered 2017-03-09: 75 mL via INTRAVENOUS

## 2017-03-09 MED ORDER — CIPROFLOXACIN IN D5W 400 MG/200ML IV SOLN
400.0000 mg | Freq: Once | INTRAVENOUS | Status: AC
  Administered 2017-03-09: 400 mg via INTRAVENOUS
  Filled 2017-03-09: qty 200

## 2017-03-09 NOTE — ED Notes (Signed)
PT left with [place] at this time. Report was given to [facility]).

## 2017-03-09 NOTE — Discharge Instructions (Signed)
Take the antibiotic as prescribed.  You will be called if the antibiotic needs to be changed.  Follow-up with your doctor.  Return to the ED if you develop new or worsening symptoms.

## 2017-03-09 NOTE — ED Provider Notes (Signed)
[facility] EMERGENCY DEPARTMENT Provider Note   CSN: [id number] Arrival date & time: 03/08/17  2225     History   Chief Complaint Chief Complaint  Patient presents with  . Abdominal Pain    HPI [name] is a 72 y.o. male.  Level 5 caveat for confusion.  Patient history obtained from living facility patient's caregiver Delois.  Patient thinks he is at the hospital to "visit somebody".  Patient's caregiver reports that she was told patient was having some shaking and complaining of not feeling well.  He mentioned abdominal pain at his facility but denies this now.  No vomiting.  No fall.  No chest pain or shortness of breath.  He is unable to give any history.  They report he is more confused than usual.  Did not know of any fever or vomiting.   The history is provided by the patient, the EMS personnel and the nursing home. The history is limited by the condition of the patient.  Abdominal Pain      Past Medical History:  Diagnosis Date  . Asthma   . Atrial flutter ([place])    s/p ablation 08-2013  . COPD (chronic obstructive pulmonary disease) ([place])   . Depression   . Diabetes mellitus without complication ([place])    TYPE 2   . Hypertension   . Nonsustained ventricular tachycardia (HCC)    no clinical significance at present  . Prostate cancer ([place])   . S/P hip replacement    remote  . Ulcerative colitis ([place])    HX OF     Patient Active Problem List   Diagnosis Date Noted  . Sepsis ([place]) 09/28/2016  . MDD (major depressive disorder), recurrent episode, moderate ([employer] [facility]) 03/01/2015  . Iatrogenic hypotension 02/20/2013  . Ventricular tachycardia-nonsustained 02/20/2013  . BRONCHITIS NOT SPECIFIED AS ACUTE OR CHRONIC 10/03/2009  . DYSLIPIDEMIA 09/30/2008  . ESSENTIAL HYPERTENSION, BENIGN 09/30/2008  . Atrial flutter ([place]) 09/30/2008  . PALPITATIONS 09/30/2008    Past Surgical History:  Procedure Laterality Date  . ABLATION  09/07/2013   CTI ablation by Dr  Caryl Comes  . ATRIAL FLUTTER ABLATION N/A 09/07/2013   Procedure: ATRIAL FLUTTER ABLATION;  Surgeon: Deboraha Sprang, MD;  Location: [facility] CATH LAB;  Service: Cardiovascular;  Laterality: N/A;  . BACK SURGERY    . HIP SURGERY Right   . ILEOSTOMY    . TONSILLECTOMY         Home Medications    Prior to Admission medications   Medication Sig Start Date End Date Taking? Authorizing Provider  cetirizine (ZYRTEC) 10 MG tablet Take 10 mg by mouth daily.   Yes [provider]  citalopram (CELEXA) 20 MG tablet Take 20 mg by mouth at bedtime.   Yes [provider]  clotrimazole (LOTRIMIN) 1 % cream Apply 1 application topically every 12 (twelve) hours as needed.   Yes [provider]  diltiazem (CARDIZEM CD) 120 MG 24 hr capsule Take 1 capsule (120 mg total) by mouth daily. 04/24/14  Yes Deboraha Sprang, MD  fluconazole (DIFLUCAN) 200 MG tablet Take 200 mg by mouth every Friday.   Yes [provider]  fluticasone (FLOVENT HFA) 220 MCG/ACT inhaler Inhale 1 puff into the lungs 3 (three) times daily.    Yes [provider]  guaifenesin (ROBITUSSIN) 100 MG/5ML syrup Take 300 mg by mouth 3 (three) times daily. 7 day course starting on 03/04/2017   Yes [provider]  metFORMIN (GLUCOPHAGE) 500 MG tablet  Take 500 mg by mouth 3 (three) times daily.   Yes [provider]  Multiple Vitamin (MULTIVITAMIN WITH MINERALS) TABS tablet Take 1 tablet by mouth daily.   Yes [provider]  nystatin (NYSTATIN) powder Apply topically 2 (two) times daily. Applied to rash   Yes [provider]  OLANZapine (ZYPREXA) 20 MG tablet Take 20 mg by mouth daily.   Yes [provider]  Olopatadine HCl (PAZEO) 0.7 % SOLN Place 1 drop into both eyes daily.   Yes [provider]  pantoprazole (PROTONIX) 40 MG tablet Take 40 mg by mouth daily.   Yes [provider]  predniSONE (DELTASONE) 5 MG tablet Take 5 mg by mouth daily with  breakfast.   Yes [provider]  rosuvastatin (CRESTOR) 10 MG tablet Take 10 mg by mouth at bedtime.   Yes [provider]  tamsulosin (FLOMAX) 0.4 MG CAPS capsule Take 0.4 mg by mouth daily.   Yes [provider]  traMADol (ULTRAM) 50 MG tablet Take 1 tablet (50 mg total) by mouth every 6 (six) hours as needed for moderate pain or severe pain. 09/30/16  Yes Henreitta Leber, MD  triamcinolone cream (KENALOG) 0.1 % Apply 1 application topically 2 (two) times daily. Rash on abdomen   Yes [provider]  vitamin C (ASCORBIC ACID) 500 MG tablet Take 500 mg by mouth 2 (two) times daily.   Yes [provider]  zinc sulfate 220 (50 Zn) MG capsule Take 220 mg by mouth daily.   Yes [provider]  insulin aspart (NOVOLOG) 100 UNIT/ML injection Inject 3 Units into the skin 3 (three) times daily with meals. Patient not taking: Reported on 03/08/2017 09/30/16   Henreitta Leber, MD    Family History Family History  Problem Relation Age of Onset  . Heart attack Father     Social History Social History   Tobacco Use  . Smoking status: Former Smoker    Packs/day: 3.00    Years: 10.00    Pack years: 30.00    Types: Cigarettes    Last attempt to quit: 12/03/1988    Years since quitting: 28.2  . Smokeless tobacco: Never Used  Substance Use Topics  . Alcohol use: No    Alcohol/week: 0.5 oz    Types: 1 Standard drinks or equivalent per week  . Drug use: No     Allergies   Cefuroxime; Clarithromycin; Erythromycin; Isradipine; Morphine; Penicillins; and Sulfonamide derivatives   Review of Systems Review of Systems  Unable to perform ROS: Mental status change  Gastrointestinal: Positive for abdominal pain.     Physical Exam Updated Vital Signs BP (!) 99/33 (BP Location: Left Arm)   Pulse 85   Temp 98.4 F (36.9 C) (Oral)   Resp 18   SpO2 94%   Physical Exam  Constitutional: He appears well-developed and well-nourished. No  distress.  HENT:  Head: Normocephalic and atraumatic.  Mouth/Throat: Oropharynx is clear and moist. No oropharyngeal exudate.  Poor dentition  Eyes: Conjunctivae and EOM are normal. Pupils are equal, round, and reactive to light.  Neck: Normal range of motion. Neck supple.  No meningismus.  Cardiovascular: Normal rate, regular rhythm, normal heart sounds and intact distal pulses.  No murmur heard. Pulmonary/Chest: Effort normal and breath sounds normal. No respiratory distress.  Abdominal: Soft. There is no tenderness. There is no rebound and no guarding.  Right lower quadrant colostomy.  There is an erythematous rash surrounding lower abdomen  Musculoskeletal:  Normal range of motion. He exhibits no edema or tenderness.  Neurological: He is alert. No cranial nerve deficit. He exhibits normal muscle tone. Coordination normal.  Patient oriented to person.  He moves all extremities.  No focal deficits  Skin: Skin is warm.  Psychiatric: He has a normal mood and affect. His behavior is normal.  Nursing note and vitals reviewed.    ED Treatments / Results  Labs (all labs ordered are listed, but only abnormal results are displayed) Labs Reviewed  CBC WITH DIFFERENTIAL/PLATELET - Abnormal; Notable for the following components:      Result Value   RBC 3.89 (*)    Hemoglobin 10.7 (*)    HCT 33.8 (*)    Lymphs Abs 0.6 (*)    All other components within normal limits  COMPREHENSIVE METABOLIC PANEL - Abnormal; Notable for the following components:   Potassium 3.2 (*)    Glucose, Bld 172 (*)    Calcium 8.7 (*)    Total Protein 6.2 (*)    Albumin 3.0 (*)    All other components within normal limits  URINALYSIS, ROUTINE W REFLEX MICROSCOPIC - Abnormal; Notable for the following components:   Color, Urine AMBER (*)    APPearance CLOUDY (*)    Protein, ur 30 (*)    Leukocytes, UA MODERATE (*)    Bacteria, UA MANY (*)    Squamous Epithelial / LPF 0-5 (*)    All other components within  normal limits  CULTURE, BLOOD (ROUTINE X 2)  CULTURE, BLOOD (ROUTINE X 2)  URINE CULTURE  LIPASE, BLOOD  INFLUENZA PANEL BY PCR (TYPE A & B)  I-STAT CG4 LACTIC ACID, ED    EKG  EKG Interpretation None       Radiology Dg Chest 2 View  Result Date: 03/09/2017 CLINICAL DATA:  Fever EXAM: CHEST  2 VIEW COMPARISON:  09/28/2016 FINDINGS: No focal pulmonary infiltrate or effusion. Stable cardiomediastinal silhouette. No pneumothorax. IMPRESSION: No active cardiopulmonary disease. Electronically Signed   By: Donavan Foil M.D.   On: 03/09/2017 02:14   Ct Head Wo Contrast  Result Date: 03/09/2017 CLINICAL DATA:  Altered level of consciousness. Abdominal pain. History of diabetes, hypertension. EXAM: CT HEAD WITHOUT CONTRAST TECHNIQUE: Contiguous axial images were obtained from the base of the skull through the vertex without intravenous contrast. COMPARISON:  CT HEAD August 23, 2016 FINDINGS: BRAIN: No intraparenchymal hemorrhage, mass effect nor midline shift. Severe parenchymal brain volume loss. No hydrocephalus. A confluent supratentorial white matter hypodensities. VASCULAR: Moderate calcific atherosclerosis of the carotid siphons and included vertebral artery's. SKULL: No skull fracture. No significant scalp soft tissue swelling. SINUSES/ORBITS: Chronic sinusitis, status post FESS. Mastoid air cells are well aerated. Included ocular globes and orbital contents are non-suspicious. OTHER: None. IMPRESSION: 1. No acute intracranial process. 2. Severe parenchymal brain volume loss and moderate chronic small vessel ischemic disease. Electronically Signed   By: Elon Alas M.D.   On: 03/09/2017 06:15   Ct Abdomen Pelvis W Contrast  Result Date: 03/09/2017 CLINICAL DATA:  Acute abdominal pain. EXAM: CT ABDOMEN AND PELVIS WITH CONTRAST TECHNIQUE: Multidetector CT imaging of the abdomen and pelvis was performed using the standard protocol following bolus administration of intravenous contrast.  CONTRAST:  28m ISOVUE-300 IOPAMIDOL (ISOVUE-300) INJECTION 61% COMPARISON:  CT 10/01/2011 FINDINGS: Lower chest: 8 mm left lower lobe pulmonary nodules unchanged from 2013 and considered benign. No consolidation. No pleural fluid. Hepatobiliary: No focal liver abnormality is seen. No gallstones, gallbladder wall thickening, or biliary dilatation. Pancreas:  No ductal dilatation or inflammation. Spleen: Normal in size without focal abnormality. Adrenals/Urinary Tract: Mild left adrenal thickening without dominant nodule. Right adrenal gland is normal. No hydronephrosis or perinephric edema. Homogeneous renal enhancement with symmetric excretion on delayed phase imaging. Pelvic floor descent with cystocele, partially obscured by streak artifact from right hip arthroplasty. Stomach/Bowel: Minimal distal esophageal wall thickening. Stomach physiologically distended. Post colectomy with right lower quadrant ileostomy. No small bowel obstruction, wall thickening or inflammation. Small peristomal hernia that is not obstructed. Vascular/Lymphatic: Aortic atherosclerosis. No aneurysm. Prominent periportal node measures 11 mm short axis, likely reactive and only minimally increased from 2013 CT. Small retroperitoneal nodes not enlarged by size criteria. Reproductive: Prostate gland not well visualized, presumed surgically absent. Other: No ascites.  No free air.  Prior laparotomy. Musculoskeletal: Degenerative and postsurgical change in the lumbar spine. Right hip arthroplasty. 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 IMPRESSION: 1. Pelvic floor descent with cystocele, of unknown acuity. 2. Right lower quadrant ileostomy with small peristomal hernia, no bowel obstruction or inflammation. 3.  Aortic Atherosclerosis (ICD10-I70.0). Electronically Signed   By: Jeb Levering M.D.   On: 03/09/2017 06:21    Procedures Procedures (including critical care time)  Medications Ordered in  ED Medications  sodium chloride 0.9 % bolus 1,000 mL (not administered)     Initial Impression / Assessment and Plan / ED Course  I have reviewed the triage vital signs and the nursing notes.  Pertinent labs & imaging results that were available during my care of the patient were reviewed by me and considered in my medical decision making (see chart for details).    Patient unable to give any meaningful history.  Sent from nursing facility with "shaking" and not feeling well complaining of leg pain and abdominal pain by report which she now denies.  Patient has stable vitals.  He is afebrile.  Altered mental status workup pursued.  CT head is stable and labs are reassuring.  UA remarkable for infection and culture sent treated with Cipro given his antibiotic allergies.  Blood pressure has improved.  He is tolerating p.o.  He is not in any distress.  His abdomen is soft without any peritoneal signs. Suspect his shaking was due to fever from his urinary tract infection.  There is no evidence of pneumonia or influenza.  Patient appears stable to return to his facility with treatment for UTI.  Blood and urine culture sent and pending. Return precautions discussed. BP (!) 145/60 (BP Location: Left Arm)   Pulse 84   Temp 98.8 F (37.1 C) (Rectal)   Resp 18   SpO2 98%   Final Clinical Impressions(s) / ED Diagnoses   Final diagnoses:  Urinary tract infection without hematuria, site unspecified    ED Discharge Orders    None       Santresa Levett, Annie Main, MD 03/09/17 [phone]

## 2017-03-10 LAB — URINE CULTURE: Culture: 70000 — AB

## 2017-03-11 ENCOUNTER — Telehealth: Payer: Self-pay

## 2017-03-11 NOTE — Telephone Encounter (Signed)
Post ED Visit - Positive Culture Follow-up  Culture report reviewed by antimicrobial stewardship pharmacist:  []  Elenor Quinones, Pharm.D. []  Heide Guile, Pharm.D., BCPS AQ-ID []  Parks Neptune, Pharm.D., BCPS [x]  Alycia Rossetti, Pharm.D., BCPS []  Howe, [place].D., BCPS, AAHIVP []  Legrand Como, Pharm.D., BCPS, AAHIVP []  Salome Arnt, PharmD, BCPS []  Jalene Mullet, PharmD []  Vincenza Hews, PharmD, BCPS  Positive urine culture Treated with Fluconazole, organism sensitive to the same and no further patient follow-up is required at this time.  Genia Del 03/11/2017, 9:48 AM

## 2017-03-14 LAB — CULTURE, BLOOD (ROUTINE X 2)
CULTURE: NO GROWTH
Culture: NO GROWTH
Special Requests: ADEQUATE
Special Requests: ADEQUATE

## 2017-03-15 ENCOUNTER — Telehealth: Payer: Self-pay | Admitting: Emergency Medicine

## 2017-03-15 NOTE — Telephone Encounter (Signed)
Post ED Visit - Positive Culture Follow-up  Culture report reviewed by antimicrobial stewardship pharmacist:  []  Elenor Quinones, Pharm.D. []  Heide Guile, Pharm.D., BCPS AQ-ID [x]  Parks Neptune, Pharm.D., BCPS []  Alycia Rossetti, Pharm.D., BCPS []  [place]rane, [place].D., BCPS, AAHIVP []  Legrand Como, Pharm.D., BCPS, AAHIVP []  Salome Arnt, PharmD, BCPS []  Jalene Mullet, PharmD []  Vincenza Hews, PharmD, BCPS  Positive blood culture Treated with fluconazole and ciprofloxacin, organism sensitive to the same and no further patient follow-up is required at this time.  Hazle Nordmann 03/15/2017, 9:44 AM

## 2017-03-24 ENCOUNTER — Encounter: Payer: Self-pay | Admitting: Nurse Practitioner

## 2017-03-24 ENCOUNTER — Ambulatory Visit (INDEPENDENT_AMBULATORY_CARE_PROVIDER_SITE_OTHER): Payer: Medicare HMO | Admitting: Nurse Practitioner

## 2017-03-24 DIAGNOSIS — R1310 Dysphagia, unspecified: Secondary | ICD-10-CM

## 2017-03-24 DIAGNOSIS — K279 Peptic ulcer, site unspecified, unspecified as acute or chronic, without hemorrhage or perforation: Secondary | ICD-10-CM

## 2017-03-24 DIAGNOSIS — R197 Diarrhea, unspecified: Secondary | ICD-10-CM

## 2017-03-24 DIAGNOSIS — K51919 Ulcerative colitis, unspecified with unspecified complications: Secondary | ICD-10-CM

## 2017-03-24 DIAGNOSIS — K5190 Ulcerative colitis, unspecified, without complications: Secondary | ICD-10-CM | POA: Insufficient documentation

## 2017-03-24 MED ORDER — PANTOPRAZOLE SODIUM 40 MG PO TBEC: 40.0000 mg | DELAYED_RELEASE_TABLET | Freq: Every day | ORAL | 11 refills | Status: DC

## 2017-03-24 NOTE — Progress Notes (Signed)
Primary Care Physician:  Theotis Burrow, MD Primary Gastroenterologist:  Dr. Gala Romney  Chief Complaint  Patient presents with  . gastric ulcer    c/o lots of gas  . Diarrhea    "all day"    HPI:   [name] is a 72 y.o. male who presents on referral from primary care for "gastric ulcer."  Reviewed documentation provided with the referral.  It appears the patient may have been admitted to [facility] in [place].  Discharge instructions printed 01/28/2017 which indicate small upper esophageal sphincter stenosis, small gastric ulcer status post EGD with TTS balloon dilation recommended Protonix daily and follow-up with GI in 2 weeks.  Recommended screening colonoscopy as outpatient.  However, there are no hospital notes provided.  I am unsure when the patient was admitted, what for, what procedures were done, what the findings were.  No procedure reports included. No details available in [place].  Patient is at [facility]. House, somewhat poor historian. Today he states he was admitted recently to the hospital for "falling." At some point an EGD was done and he was told he had an ulcer. He was put on a PPI. Per Med Rec at nursing facility he is on Protonix daily. He is also on Ibuprofen prn. Heartburn symptoms are rare. Occasional solid food dysphagia, no noted improvement after EGD with dilation at [facility]. Had single episode of rectal bleeding 3 months ago and none since (he thinks it is due to eating a lot of ketchup.) Has mid-abdominal, periumbilical abdominal pain almost every day, described as burning and "it hurts." Has seen a couple tarry black stools, last episode about 3 weeks ago. Energy "not been the best." Has chronic fatigue, worse in the past month. Is having persistent diarrhea, about 7-8 times a day; loose, diarrhea which is chronic for him since total colectomy in the 1960s for UC. Doesn't think he's had a colonoscopy since then. Denies chest pain, dyspnea,  dizziness, lightheadedness, syncope, near syncope. Denies any other upper or lower GI symptoms.  Unsure of when his last colonoscopy was done. Knows one was done in [place]. 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  Past Medical History:  Diagnosis Date  . Asthma   . Atrial flutter ([place])    s/p ablation 08-2013  . COPD (chronic obstructive pulmonary disease) ([place])   . Depression   . Diabetes mellitus without complication ([place])    TYPE 2   . Hypertension   . Nonsustained ventricular tachycardia (HCC)    no clinical significance at present  . Prostate cancer ([place])   . S/P hip replacement    remote  . Ulcerative colitis ([place])    HX OF     Past Surgical History:  Procedure Laterality Date  . ABLATION  09/07/2013   CTI ablation by Dr Caryl Comes  . ATRIAL FLUTTER ABLATION N/A 09/07/2013   Procedure: ATRIAL FLUTTER ABLATION;  Surgeon: Deboraha Sprang, MD;  Location: [facility] CATH LAB;  Service: Cardiovascular;  Laterality: N/A;  . BACK SURGERY    . HIP SURGERY Right   . ILEOSTOMY    . TONSILLECTOMY      Current Outpatient Medications  Medication Sig Dispense Refill  . cetirizine (ZYRTEC) 10 MG tablet Take 10 mg by mouth daily.    . citalopram (CELEXA) 20 MG tablet Take 20 mg by mouth at bedtime.    [name]  clotrimazole (LOTRIMIN) 1 % cream Apply 1 application topically every 12 (twelve) hours as needed.    . diltiazem (CARDIZEM CD) 120 MG 24 hr capsule Take 1 capsule (120 mg total) by mouth daily. 90 capsule 3  . fluconazole (DIFLUCAN) 200 MG tablet Take 200 mg by mouth every Friday.    . fluticasone (FLOVENT HFA) 220 MCG/ACT inhaler Inhale 1 puff into the lungs 2 (two) times daily.     [name] ibuprofen (ADVIL,MOTRIN) 600 MG tablet Take 600 mg by mouth every 6 (six) hours as needed.    . metFORMIN (GLUCOPHAGE) 500 MG tablet Take 500 mg by mouth 3 (three) times daily.    . Multiple  Vitamin (MULTIVITAMIN WITH MINERALS) TABS tablet Take 1 tablet by mouth daily.    [name] nystatin (NYSTATIN) powder Apply topically 2 (two) times daily. Applied to rash    . OLANZapine (ZYPREXA) 20 MG tablet Take 20 mg by mouth daily.    . Olopatadine HCl (PAZEO) 0.7 % SOLN Place 1 drop into both eyes daily.    . pantoprazole (PROTONIX) 40 MG tablet Take 40 mg by mouth daily.    . predniSONE (DELTASONE) 5 MG tablet Take 5 mg by mouth daily with breakfast.    . rosuvastatin (CRESTOR) 10 MG tablet Take 10 mg by mouth at bedtime.    . tamsulosin (FLOMAX) 0.4 MG CAPS capsule Take 0.4 mg by mouth daily.    . traMADol (ULTRAM) 50 MG tablet Take 1 tablet (50 mg total) by mouth every 6 (six) hours as needed for moderate pain or severe pain. 30 tablet 0  . triamcinolone cream (KENALOG) 0.1 % Apply 1 application topically 2 (two) times daily. Rash on abdomen    . vitamin C (ASCORBIC ACID) 500 MG tablet Take 500 mg by mouth daily.     [name] zinc sulfate 220 (50 Zn) MG capsule Take 220 mg by mouth daily.     No current facility-administered medications for this visit.     Allergies as of 03/24/2017 - Review Complete 03/24/2017  Allergen Reaction Noted  . Cefuroxime Rash 02/28/2015  . Clarithromycin Rash 02/28/2015  . Erythromycin Rash   . Isradipine Rash 02/28/2015  . Morphine Itching and Other (See Comments)   . Penicillins Rash and Other (See Comments)   . Sulfonamide derivatives Rash     Family History  Problem Relation Age of Onset  . Heart attack Father   . Colon cancer Neg Hx     Social History   Socioeconomic History  . Marital status: Single    Spouse name: Not on file  . Number of children: Not on file  . Years of education: Not on file  . Highest education level: Not on file  Social Needs  . Financial resource strain: Not on file  . Food insecurity - worry: Not on file  . Food insecurity - inability: Not on file  . Transportation needs - medical: Not on file  . Transportation needs  - non-medical: Not on file  Occupational History  . Not on file  Tobacco Use  . Smoking status: Former Smoker    Packs/day: 3.00    Years: 10.00    Pack years: 30.00    Types: Cigarettes    Last attempt to quit: 12/03/1988    Years since quitting: 28.3  . Smokeless tobacco: Never Used  Substance and Sexual Activity  . Alcohol use: No    Alcohol/week: 0.5 oz    Types: 1 Standard drinks or equivalent  per week  . Drug use: No  . Sexual activity: Not on file  Other Topics Concern  . Not on file  Social History Narrative   From a care home    Review of Systems: General: Negative for anorexia, weight loss, fever, chills, fatigue, weakness. ENT: Negative for hoarseness. Intermittent dysphagia, chronic. CV: Negative for chest pain, angina, palpitations, peripheral edema.  Respiratory: Negative for dyspnea at rest, dyspnea on exertion, cough, sputum, wheezing.  GI: See history of present illness. MS: Negative for joint pain, low back pain.  Derm: Negative for rash or itching.  Neuro: Negative for weakness, abnormal sensation, seizure, frequent headaches, memory loss, confusion.  Psych: Negative for anxiety, depression, suicidal ideation, hallucinations.  Endo: Negative for unusual weight change.  Heme: Negative for bruising or bleeding. Allergy: Negative for rash or hives.    Physical Exam: BP 117/71   Pulse 93   Temp (!) 97 F (36.1 C) (Oral)   Ht 5' 9.5" (1.765 m)   BMI 26.93 kg/m  General:   Alert and oriented. Pleasant and cooperative. Well-nourished and well-developed. Sitting in a wheelchair. Head:  Normocephalic and atraumatic. Eyes:  Without icterus, sclera clear and conjunctiva pink.  Ears:  Normal auditory acuity. Mouth:  Majority of teeth missing.  Cardiovascular:  S1, S2 present without murmurs appreciated. Extremities without clubbing or edema. Respiratory:  Clear to auscultation bilaterally. No wheezes, rales, or rhonchi. No distress.  Gastrointestinal:   +BS, soft, non-tender and non-distended. No HSM noted. No guarding or rebound. No masses appreciated. Apparently ileostomy in place with no bag. Rectal:  Deferred  Musculoskalatal:  Symmetrical without gross deformities. Neurologic:  Alert and oriented x4;  grossly normal neurologically. Psych:  Alert and cooperative. Normal mood and affect. Heme/Lymph/Immune: No excessive bruising noted.    03/24/2017 10:49 AM   Disclaimer: This note was dictated with voice recognition software. Similar sounding words can inadvertently be transcribed and may not be corrected upon review.

## 2017-03-24 NOTE — Progress Notes (Signed)
cc'ed to pcp °

## 2017-03-24 NOTE — Assessment & Plan Note (Signed)
The patient indicates approximately 8 diarrhea stools a day.  He does have a history of ulcerative colitis status post total colectomy.  He states his diarrhea is chronic since this.  It is concerning that he does not have ostomy supplies at this time at his facility and they are, per the patient, padding his ileostomy with absorbent materials and towels until they can obtain ostomy supplies.  It is imperative to obtain appropriate supplies in order to prevent skin breakdown.  Follow-up in 6 weeks

## 2017-03-24 NOTE — Assessment & Plan Note (Signed)
The patient has a history of ulcerative colitis and surgical remission status post total colectomy, per the patient.  He has an ileostomy.  No colonoscopies since then, per his recollection.  He thinks his surgery was completed in the 1960s.  Continue to monitor, follow-up in 6 weeks.

## 2017-03-24 NOTE — Assessment & Plan Note (Signed)
Patient notes chronic dysphagia.  Per his discharge papers he underwent dilation during his EGD.  He has not noticed a difference with this.  No improvement with PPI.  He has very poor dentition and this is likely related.  He could also have age-related dysmotility.  Recommend he chew his food adequately, soft foods.  Peptic ulcer disease management as per above.  Follow-up in 6 weeks.  May need a barium pill esophagram if dysphagia is persistent.

## 2017-03-24 NOTE — Patient Instructions (Signed)
1. We will request your records from your hospital stay. 2. I will have the facility increase your acid blocker to twice a day. 3. I will have the facility not give you any medicines that can make your ulcer worse. 4. Return for follow-up in 6 weeks. 5. Have the facility call if there is any questions or concerns.

## 2017-03-24 NOTE — Assessment & Plan Note (Signed)
Peptic ulcer disease per patient hospital discharge information.  Apparently he had an EGD with dilation done.  No records available.  Request all hospital records related to his admission.  He does have persistent abdominal pain which he feels is due to his ulcer.  I will increase his Protonix to twice a day.  Stop all NSAIDs.  Follow-up in 6 weeks.  He may need surveillance endoscopy related to peptic ulcer disease.

## 2017-04-21 ENCOUNTER — Telehealth: Payer: Self-pay

## 2017-04-21 NOTE — Telephone Encounter (Signed)
Received a call from [facility]. She a coordinator that is trying to get the correct size of ostomy supplies , pt should be using. When pt was seen at last ov, he didn't have ostomy supplies on. See EG note 03/2017. Hoyle Sauer was asked to check with the pcp.

## 2017-05-20 ENCOUNTER — Encounter: Payer: Self-pay | Admitting: Nurse Practitioner

## 2017-05-20 ENCOUNTER — Ambulatory Visit: Payer: Medicare HMO | Admitting: Nurse Practitioner

## 2017-07-11 ENCOUNTER — Telehealth: Payer: Self-pay

## 2017-07-11 NOTE — Telephone Encounter (Signed)
T/C from Lorayne Bender, nurse at the [facility] in [place], pt had only liquid ( like watery) with red tinge in his colostomy this AM. She described the red as between light to dark red.  This is the first time it has occurred.  Pt has hx of UC.  He is not having any other symptoms per [place].  Her call back number is [phone].   Forwarding to Roseanne Kaufman, NP who is doing hospital call today.  Please advise!

## 2017-07-11 NOTE — Telephone Encounter (Signed)
History of UC s/p colectomy with ileostomy placement in remote past. 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 History of PUD in Jan 2019 from Consult note March 2019 when first seen by our office.   I don't have operative notes. Unclear if he had a total proctocolectomy or if rectum remains. He was to follow-up after last appt but was lost to follow-up.  Bleeding from ostomy could be multiple reasons: could be related to friable tissue, known peristomal hernia, unable to rule out upper GI source in setting of prior PUD . If they are able to check CBC today, we need that.   Previously seen by Randall Hiss. I have never seen him before. There are multiple loose ends to tie up that were to be addressed in follow-up, but he didn't come. Need to discuss surveillance EGD due to history of PUD, need to find out about records from colectomy (if still has rectum needs this evaluated due to risk of dysplasia, cancer).  If any black stool per ostomy, needs ED evaluation. If persistent large volume bleeding, needs ED evaluation.   Please arrange follow-up with Randall Hiss in meantime.

## 2017-07-11 NOTE — Telephone Encounter (Signed)
Called and was on phone holding x 14 min. Hung up, called back and told the lady I had to call back to get anyone. She left me on hold again, 8 min and then told me she would page Jenny Reichmann, again at 12 min, she told me she was just walking to see why Jenny Reichmann was not answering her page. At 14 min, she told me she was in a meeting, I told her I needed to speak to another nurse, we need to order a CBC with Diff today. She said she would write the info down and relay the message and asked me to call back in 30 min.

## 2017-07-11 NOTE — Telephone Encounter (Signed)
Jenny Reichmann returned call and is aware of the plan. She said they probably can't do CBC this afternoon, but can do it tomorrow morning.  She just took a verbal order and will fax results here and put attn to me.  She is aware pt should go to ED if much bleeding or dark stools. I have scheduled him a follow up with Walden Field, NP for 07/14/2017 at 10:30 Am and to arrive at 10:15 am. Jenny Reichmann will check with scheduling and have someone call and confirm that appt.

## 2017-07-11 NOTE — Telephone Encounter (Signed)
Received call from transportation and wanted to know if anything was available later that day. I advised it was not. They are going to make other arrangements to have patient at this appointment.

## 2017-07-14 ENCOUNTER — Telehealth: Payer: Self-pay

## 2017-07-14 ENCOUNTER — Ambulatory Visit (INDEPENDENT_AMBULATORY_CARE_PROVIDER_SITE_OTHER): Payer: Medicare HMO | Admitting: Nurse Practitioner

## 2017-07-14 ENCOUNTER — Encounter: Payer: Self-pay | Admitting: Nurse Practitioner

## 2017-07-14 VITALS — BP 117/75 | HR 79 | Temp 98.4°F | Ht 70.0 in

## 2017-07-14 DIAGNOSIS — K279 Peptic ulcer, site unspecified, unspecified as acute or chronic, without hemorrhage or perforation: Secondary | ICD-10-CM | POA: Diagnosis not present

## 2017-07-14 DIAGNOSIS — D649 Anemia, unspecified: Secondary | ICD-10-CM | POA: Diagnosis not present

## 2017-07-14 DIAGNOSIS — K469 Unspecified abdominal hernia without obstruction or gangrene: Secondary | ICD-10-CM | POA: Diagnosis not present

## 2017-07-14 NOTE — Telephone Encounter (Signed)
[facility] in [place] to schedule EGD w/Propofol w/RMR. Receptionist will give message to transporter Claiborne Billings) to call office back.

## 2017-07-14 NOTE — Progress Notes (Signed)
Referring Provider: Theotis Burrow* Primary Care Physician:  Theotis Burrow, MD Primary GI:  Dr. Gala Romney  Chief Complaint  Patient presents with  . Peptic Ulcer Disease    f/u. Doing okay. Only complaint is he is urinating a lot    HPI:   [name] is a 72 y.o. male who presents for follow-up on peptic ulcer disease.  The patient was last seen in our office 03/24/2017 for peptic ulcer disease, dysphagia, diarrhea, ulcerative colitis.  At last visit queried admission to [facility] in [place], [place].  Discharge instructions printed 01/28/2017 indicate small upper esophageal sphincter stenosis, small gastric ulcer status post EGD with TTS balloon dilation and recommended Protonix daily with follow-up with GI in 2 months.  Recommended outpatient screening colonoscopy.  No other details related to date or cause of admission available.  No details in care everywhere.  He is currently a resident at [facility].  Medication reconciliation from nursing home indicated daily Protonix.  He is also on ibuprofen as needed.  Rare heartburn symptoms and occasional solid food dysphagia with improvement after dilation.  Single episode of rectal bleeding 3 months prior and none since.  Noted a couple black tarry stools with last episode 3 weeks prior.  Poor energy.  Persistent diarrhea 7-8 times a day, loose which is chronic for him since total colectomy in the 1960s for ulcerative colitis.  Does not think he had a colonoscopy since then, he does know that one was completed and [place] at some point.  Recommended requesting hospital records, increase PPI to twice daily, follow-up in 6 weeks.  Previous hospital records were never received.  I did receive labs drawn 03/30/2016 which found hemoglobin to 13.5.  CBC dated 07/12/2017 found to decline hemoglobin 12.0.  Today he states he's doing ok. Has a lot of cravings for sweets. Has rare intermittent GERD symptoms. Facility didn't change PPI  to twice daily as previously recommended for PUD. Denies abdominal pain (other than when he eats sweets), N/V. Had one episode of bright red blood in his ostomy bag. Had one episode of "really dark stuff" in ostomy bag 2-3 weeks ago. Energy is good. Denies dyspnea, chest pain. Does have chronic dizziness. Denies chest pain, dyspnea, dizziness, lightheadedness, syncope, near syncope. Denies any other upper or lower GI symptoms.  Today he states when he had surgery, they were going to leave a rectum but they decided to take the rectum out.  Past Medical History:  Diagnosis Date  . Asthma   . Atrial flutter ([place])    s/p ablation 08-2013  . COPD (chronic obstructive pulmonary disease) ([place])   . Depression   . Diabetes mellitus without complication ([place])    TYPE 2   . Hypertension   . Nonsustained ventricular tachycardia ([facility])    no clinical significance at present  . Prostate cancer ([place])   . S/P hip replacement    remote  . Ulcerative colitis ([place])    HX OF     Past Surgical History:  Procedure Laterality Date  . ABLATION  09/07/2013   CTI ablation by Dr Caryl Comes  . ATRIAL FLUTTER ABLATION N/A 09/07/2013   Procedure: ATRIAL FLUTTER ABLATION;  Surgeon: Deboraha Sprang, MD;  Location: [facility] CATH LAB;  Service: Cardiovascular;  Laterality: N/A;  . BACK SURGERY    . HIP SURGERY Right   . ILEOSTOMY    . TONSILLECTOMY      Current Outpatient Medications  Medication Sig Dispense Refill  .  atorvastatin (LIPITOR) 20 MG tablet Take 20 mg by mouth daily.    . cetirizine (ZYRTEC) 10 MG tablet Take 10 mg by mouth daily.    . Cholecalciferol 50000 units capsule Take 50,000 Units by mouth every 7 (seven) days.    . citalopram (CELEXA) 20 MG tablet Take 20 mg by mouth at bedtime.    [name] diltiazem (CARDIZEM CD) 120 MG 24 hr capsule Take 1 capsule (120 mg total) by mouth daily. (Patient taking differently: Take 30 mg by mouth daily. ) 90 capsule 3  . fluticasone (FLOVENT HFA) 220 MCG/ACT inhaler Inhale 1  puff into the lungs 2 (two) times daily.     . Melatonin 3 MG TABS Take by mouth at bedtime.    . metFORMIN (GLUCOPHAGE) 500 MG tablet Take 500 mg by mouth 2 (two) times daily with a meal.     . Multiple Vitamin (MULTIVITAMIN WITH MINERALS) TABS tablet Take 1 tablet by mouth daily.    [name] nystatin (NYSTATIN) powder Apply topically as needed. Applied to rash     . OLANZapine (ZYPREXA) 20 MG tablet Take 20 mg by mouth daily.    . Olopatadine HCl (PAZEO) 0.7 % SOLN Place 1 drop into both eyes daily.    . pantoprazole (PROTONIX) 40 MG tablet Take 1 tablet (40 mg total) by mouth daily. 60 tablet 11  . predniSONE (DELTASONE) 5 MG tablet Take 5 mg by mouth daily with breakfast.    . tamsulosin (FLOMAX) 0.4 MG CAPS capsule Take 0.4 mg by mouth daily.    . traMADol (ULTRAM) 50 MG tablet Take 1 tablet (50 mg total) by mouth every 6 (six) hours as needed for moderate pain or severe pain. 30 tablet 0   No current facility-administered medications for this visit.     Allergies as of 07/14/2017 - Review Complete 07/14/2017  Allergen Reaction Noted  . Cefuroxime Rash 02/28/2015  . Clarithromycin Rash 02/28/2015  . Erythromycin Rash   . Isradipine Rash 02/28/2015  . Morphine Itching and Other (See Comments)   . Penicillins Rash and Other (See Comments)   . Sulfonamide derivatives Rash     Family History  Problem Relation Age of Onset  . Heart attack Father   . Colon cancer Cousin     Social History   Socioeconomic History  . Marital status: Single    Spouse name: Not on file  . Number of children: Not on file  . Years of education: Not on file  . Highest education level: Not on file  Occupational History  . Not on file  Social Needs  . Financial resource strain: Not on file  . Food insecurity:    Worry: Not on file    Inability: Not on file  . Transportation needs:    Medical: Not on file    Non-medical: Not on file  Tobacco Use  . Smoking status: Former Smoker    Packs/day: 3.00     Years: 10.00    Pack years: 30.00    Types: Cigarettes    Last attempt to quit: 12/03/1988    Years since quitting: 28.6  . Smokeless tobacco: Never Used  Substance and Sexual Activity  . Alcohol use: No    Alcohol/week: 0.6 oz    Types: 1 Standard drinks or equivalent per week  . Drug use: No  . Sexual activity: Not on file  Lifestyle  . Physical activity:    Days per week: Not on file    Minutes per  session: Not on file  . Stress: Not on file  Relationships  . Social connections:    Talks on phone: Not on file    Gets together: Not on file    Attends religious service: Not on file    Active member of club or organization: Not on file    Attends meetings of clubs or organizations: Not on file    Relationship status: Not on file  Other Topics Concern  . Not on file  Social History Narrative   From a care home    Review of Systems: General: Negative for anorexia, weight loss, fever, chills, fatigue, weakness.  ENT: Negative for hoarseness, difficulty swallowing. CV: Negative for chest pain, angina, palpitations, peripheral edema.  Respiratory: Negative for dyspnea at rest, cough, sputum, wheezing.  GI: See history of present illness. Derm: Negative for rash or itching.  Neuro: Negative for weakness, abnormal sensation, seizure, frequent headaches, memory loss, confusion.  Psych: Negative for anxiety, depression, suicidal ideation, hallucinations.  Endo: Negative for unusual weight change.  Heme: Negative for bruising or bleeding. Allergy: Negative for rash or hives.   Physical Exam: BP 117/75   Pulse 79   Temp 98.4 F (36.9 C) (Oral)   Ht 5' 10"  (1.778 m)   BMI 26.54 kg/m  General:   Alert and oriented. Pleasant and cooperative. Well-nourished and well-developed. Sitting in wheelchair. Eyes:  Without icterus, sclera clear and conjunctiva pink.  Ears:  Normal auditory acuity. Mouth:  Missing majority of teeth; [ppr dentition.  Cardiovascular:  S1, S2 present  without murmurs appreciated. Extremities without clubbing or edema. Respiratory:  Clear to auscultation bilaterally. No wheezes, rales, or rhonchi. No distress.  Gastrointestinal:  +BS, soft, non-tender and non-distended. No HSM noted. No guarding or rebound. No masses appreciated.  Rectal:  Deferred  Musculoskalatal:  Symmetrical without gross deformities. Neurologic:  Alert and oriented x4;  grossly normal neurologically. Psych:  Alert and cooperative. Normal mood and affect. Heme/Lymph/Immune: No excessive bruising noted.    07/17/2017 9:22 PM   Disclaimer: This note was dictated with voice recognition software. Similar sounding words can inadvertently be transcribed and may not be corrected upon review.

## 2017-07-14 NOTE — Patient Instructions (Signed)
1. Follow-up with the facility to schedule your upper endoscopy. 2. We will put in orders to have your labs drawn in 2 weeks. 3. We will have you come back in 3 months. 4. Call us if you have any problems or concerns before then.  It was great to see you today!  I hope you have a wonderful summer!!

## 2017-07-17 ENCOUNTER — Encounter: Payer: Self-pay | Admitting: Nurse Practitioner

## 2017-07-17 NOTE — Assessment & Plan Note (Signed)
Previous PUD on EGD documentation with hospital admission. Never had surveillance EGD. Last OV recommended bid PPI and this was not completed. Again recommended bid PPI. Avoid NSAIDs. Plan for EGD surveillance, follow-up in 3 months.  Proceed with EGD on propofol/MAC with Dr. Gala Romney in near future: the risks, benefits, and alternatives have been discussed with the patient in detail. The patient states understanding and desires to proceed.  Patient is currently on Ultram, Celexa. No other anticoagulants, pain medications, anxiolytics, or antidepressants. Will plan for procedure on propofol/MAC to promote adequate sedation.

## 2017-07-17 NOTE — Assessment & Plan Note (Signed)
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 States he's had a couple episodes of "black stuff' in ostomy bag. Query etiology of previous documented PUD versus friable bleeding. Apparent anemia (mild) recently declined to hgb 12.0 .Will recheck CBC in 2 weeks to continue to trend hgb. Avoid NSAIDs and surveillance EGD as per below. Follow-up in 3 months.

## 2017-07-17 NOTE — Assessment & Plan Note (Signed)
Known anemia (mild) with hgb 13.5 03/2016 which declined to 12.0 07/12/2017. Known friable ostomy and documented PUD. Surveillance EGD as per below. CBC in 2 weeks. Increase Protonix to bid. Avoid NSAIDs. Follow-up in 3 months.

## 2017-07-18 ENCOUNTER — Other Ambulatory Visit: Payer: Self-pay

## 2017-07-18 DIAGNOSIS — K469 Unspecified abdominal hernia without obstruction or gangrene: Secondary | ICD-10-CM

## 2017-07-18 DIAGNOSIS — D649 Anemia, unspecified: Secondary | ICD-10-CM

## 2017-07-18 DIAGNOSIS — K279 Peptic ulcer, site unspecified, unspecified as acute or chronic, without hemorrhage or perforation: Secondary | ICD-10-CM

## 2017-07-18 NOTE — Telephone Encounter (Signed)
[facility], transporter not available. Staff member to message for transporter to call office.

## 2017-07-18 NOTE — Telephone Encounter (Signed)
Pre-op appt 09/21/17 at 10:00am. Tried to call transporter, no answer, LMOVM. Procedure instructions and pre-op appt letter faxed to facility (attn: Claiborne Billings).

## 2017-07-18 NOTE — Progress Notes (Signed)
cc'd to pcp 

## 2017-09-16 NOTE — Patient Instructions (Signed)
[name]  09/16/2017     @PREFPERIOPPHARMACY @   Your procedure is scheduled on  09/26/2017   Report to [facility] at  88   A.M.  Call this number if you have problems the morning of surgery:  [phone]   Remember:  Do not eat or drink after midnight.  You may drink clear liquids until  (follow the instructions given to you) .  Clear liquids allowed are:                    Water, Juice (non-citric and without pulp), Carbonated beverages, Clear Tea, Black Coffee only, Plain Jell-O only, Gatorade and Plain Popsicles only    Take these medicines the morning of surgery with A SIP OF WATER  Diltiazem, zantac, tramadol. Use your inhaler before you come.    Do not wear jewelry, make-up or nail polish.  Do not wear lotions, powders, or perfumes, or deodorant.  Do not shave 48 hours prior to surgery.  Men may shave face and neck.  Do not bring valuables to the hospital.  [facility] is not responsible for any belongings or valuables.  Contacts, dentures or bridgework may not be worn into surgery.  Leave your suitcase in the car.  After surgery it may be brought to your room.  For patients admitted to the hospital, discharge time will be determined by your treatment team.  Patients discharged the day of surgery will not be allowed to drive home.   Name and phone number of your driver:   family Special instructions:  Follow the diet and prep instructions given to you by Dr Roseanne Kaufman office.  Please read over the following fact sheets that you were given. Anesthesia Post-op Instructions and Care and Recovery After Surgery       Esophagogastroduodenoscopy Esophagogastroduodenoscopy (EGD) is a procedure to examine the lining of the esophagus, stomach, and first part of the small intestine (duodenum). This procedure is done to check for problems such as inflammation, bleeding, ulcers, or growths. During this procedure, a long, flexible, lighted tube with a camera  attached (endoscope) is inserted down the throat. Tell a health care provider about:  Any allergies you have.  All medicines you are taking, including vitamins, herbs, eye drops, creams, and over-the-counter medicines.  Any problems you or family members have had with anesthetic medicines.  Any blood disorders you have.  Any surgeries you have had.  Any medical conditions you have.  Whether you are pregnant or may be pregnant. What are the risks? Generally, this is a safe procedure. However, problems may occur, including:  Infection.  Bleeding.  A tear (perforation) in the esophagus, stomach, or duodenum.  Trouble breathing.  Excessive sweating.  Spasms of the larynx.  A slowed heartbeat.  Low blood pressure.  What happens before the procedure?  Follow instructions from your health care provider about eating or drinking restrictions.  Ask your health care provider about: ? Changing or stopping your regular medicines. This is especially important if you are taking diabetes medicines or blood thinners. ? Taking medicines such as aspirin and ibuprofen. These medicines can thin your blood. Do not take these medicines before your procedure if your health care provider instructs you not to.  Plan to have someone take you home after the procedure.  If you wear dentures, be ready to remove them before the procedure. What happens during the procedure?  To reduce your risk of  infection, your health care team will wash or sanitize their hands.  An IV tube will be put in a vein in your hand or arm. You will get medicines and fluids through this tube.  You will be given one or more of the following: ? A medicine to help you relax (sedative). ? A medicine to numb the area (local anesthetic). This medicine may be sprayed into your throat. It will make you feel more comfortable and keep you from gagging or coughing during the procedure. ? A medicine for pain.  A mouth guard  may be placed in your mouth to protect your teeth and to keep you from biting on the endoscope.  You will be asked to lie on your left side.  The endoscope will be lowered down your throat into your esophagus, stomach, and duodenum.  Air will be put into the endoscope. This will help your health care provider see better.  The lining of your esophagus, stomach, and duodenum will be examined.  Your health care provider may: ? Take a tissue sample so it can be looked at in a lab (biopsy). ? Remove growths. ? Remove objects (foreign bodies) that are stuck. ? Treat any bleeding with medicines or other devices that stop tissue from bleeding. ? Widen (dilate) or stretch narrowed areas of your esophagus and stomach.  The endoscope will be taken out. The procedure may vary among health care providers and hospitals. What happens after the procedure?  Your blood pressure, heart rate, breathing rate, and blood oxygen level will be monitored often until the medicines you were given have worn off.  Do not eat or drink anything until the numbing medicine has worn off and your gag reflex has returned. This information is not intended to replace advice given to you by your health care provider. Make sure you discuss any questions you have with your health care provider. Document Released: 05/07/2004 Document Revised: 06/12/2015 Document Reviewed: 11/28/2014 Elsevier Interactive Patient Education  2018 [employer]. Esophagogastroduodenoscopy, Care After Refer to this sheet in the next few weeks. These instructions provide you with information about caring for yourself after your procedure. Your health care provider may also give you more specific instructions. Your treatment has been planned according to current medical practices, but problems sometimes occur. Call your health care provider if you have any problems or questions after your procedure. What can I expect after the procedure? After the  procedure, it is common to have:  A sore throat.  Nausea.  Bloating.  Dizziness.  Fatigue.  Follow these instructions at home:  Do not eat or drink anything until the numbing medicine (local anesthetic) has worn off and your gag reflex has returned. You will know that the local anesthetic has worn off when you can swallow comfortably.  Do not drive for 24 hours if you received a medicine to help you relax (sedative).  If your health care provider took a tissue sample for testing during the procedure, make sure to get your test results. This is your responsibility. Ask your health care provider or the department performing the test when your results will be ready.  Keep all follow-up visits as told by your health care provider. This is important. Contact a health care provider if:  You cannot stop coughing.  You are not urinating.  You are urinating less than usual. Get help right away if:  You have trouble swallowing.  You cannot eat or drink.  You have throat or chest pain  that gets worse.  You are dizzy or light-headed.  You faint.  You have nausea or vomiting.  You have chills.  You have a fever.  You have severe abdominal pain.  You have black, tarry, or bloody stools. This information is not intended to replace advice given to you by your health care provider. Make sure you discuss any questions you have with your health care provider. Document Released: 12/22/2011 Document Revised: 06/12/2015 Document Reviewed: 11/28/2014 Elsevier Interactive Patient Education  2018 [employer] Anesthesia is a term that refers to techniques, procedures, and medicines that help a person stay safe and comfortable during a medical procedure. Monitored anesthesia care, or sedation, is one type of anesthesia. Your anesthesia specialist may recommend sedation if you will be having a procedure that does not require you to be unconscious, such  as:  Cataract surgery.  A dental procedure.  A biopsy.  A colonoscopy.  During the procedure, you may receive a medicine to help you relax (sedative). There are three levels of sedation:  Mild sedation. At this level, you may feel awake and relaxed. You will be able to follow directions.  Moderate sedation. At this level, you will be sleepy. You may not remember the procedure.  Deep sedation. At this level, you will be asleep. You will not remember the procedure.  The more medicine you are given, the deeper your level of sedation will be. Depending on how you respond to the procedure, the anesthesia specialist may change your level of sedation or the type of anesthesia to fit your needs. An anesthesia specialist will monitor you closely during the procedure. Let your health care provider know about:  Any allergies you have.  All medicines you are taking, including vitamins, herbs, eye drops, creams, and over-the-counter medicines.  Any use of steroids (by mouth or as a cream).  Any problems you or family members have had with sedatives and anesthetic medicines.  Any blood disorders you have.  Any surgeries you have had.  Any medical conditions you have, such as sleep apnea.  Whether you are pregnant or may be pregnant.  Any use of cigarettes, alcohol, or street drugs. What are the risks? Generally, this is a safe procedure. However, problems may occur, including:  Getting too much medicine (oversedation).  Nausea.  Allergic reaction to medicines.  Trouble breathing. If this happens, a breathing tube may be used to help with breathing. It will be removed when you are awake and breathing on your own.  Heart trouble.  Lung trouble.  Before the procedure Staying hydrated Follow instructions from your health care provider about hydration, which may include:  Up to 2 hours before the procedure - you may continue to drink clear liquids, such as water, clear fruit  juice, black coffee, and plain tea.  Eating and drinking restrictions Follow instructions from your health care provider about eating and drinking, which may include:  8 hours before the procedure - stop eating heavy meals or foods such as meat, fried foods, or fatty foods.  6 hours before the procedure - stop eating light meals or foods, such as toast or cereal.  6 hours before the procedure - stop drinking milk or drinks that contain milk.  2 hours before the procedure - stop drinking clear liquids.  Medicines Ask your health care provider about:  Changing or stopping your regular medicines. This is especially important if you are taking diabetes medicines or blood thinners.  Taking medicines such  as aspirin and ibuprofen. These medicines can thin your blood. Do not take these medicines before your procedure if your health care provider instructs you not to.  Tests and exams  You will have a physical exam.  You may have blood tests done to show: ? How well your kidneys and liver are working. ? How well your blood can clot.  General instructions  Plan to have someone take you home from the hospital or clinic.  If you will be going home right after the procedure, plan to have someone with you for 24 hours.  What happens during the procedure?  Your blood pressure, heart rate, breathing, level of pain and overall condition will be monitored.  An IV tube will be inserted into one of your veins.  Your anesthesia specialist will give you medicines as needed to keep you comfortable during the procedure. This may mean changing the level of sedation.  The procedure will be performed. After the procedure  Your blood pressure, heart rate, breathing rate, and blood oxygen level will be monitored until the medicines you were given have worn off.  Do not drive for 24 hours if you received a sedative.  You may: ? Feel sleepy, clumsy, or nauseous. ? Feel forgetful about what  happened after the procedure. ? Have a sore throat if you had a breathing tube during the procedure. ? Vomit. This information is not intended to replace advice given to you by your health care provider. Make sure you discuss any questions you have with your health care provider. Document Released: 09/30/2004 Document Revised: 06/13/2015 Document Reviewed: 04/27/2015 Elsevier Interactive Patient Education  2018 [place], Care After These instructions provide you with information about caring for yourself after your procedure. Your health care provider may also give you more specific instructions. Your treatment has been planned according to current medical practices, but problems sometimes occur. Call your health care provider if you have any problems or questions after your procedure. What can I expect after the procedure? After your procedure, it is common to:  Feel sleepy for several hours.  Feel clumsy and have poor balance for several hours.  Feel forgetful about what happened after the procedure.  Have poor judgment for several hours.  Feel nauseous or vomit.  Have a sore throat if you had a breathing tube during the procedure.  Follow these instructions at home: For at least 24 hours after the procedure:   Do not: ? Participate in activities in which you could fall or become injured. ? Drive. ? Use heavy machinery. ? Drink alcohol. ? Take sleeping pills or medicines that cause drowsiness. ? Make important decisions or sign legal documents. ? Take care of children on your own.  Rest. Eating and drinking  Follow the diet that is recommended by your health care provider.  If you vomit, drink water, juice, or soup when you can drink without vomiting.  Make sure you have little or no nausea before eating solid foods. General instructions  Have a responsible adult stay with you until you are awake and alert.  Take over-the-counter and  prescription medicines only as told by your health care provider.  If you smoke, do not smoke without supervision.  Keep all follow-up visits as told by your health care provider. This is important. Contact a health care provider if:  You keep feeling nauseous or you keep vomiting.  You feel light-headed.  You develop a rash.  You have a fever.  Get help right away if:  You have trouble breathing. This information is not intended to replace advice given to you by your health care provider. Make sure you discuss any questions you have with your health care provider. Document Released: 04/27/2015 Document Revised: 08/27/2015 Document Reviewed: 04/27/2015 Elsevier Interactive Patient Education  [employer].

## 2017-09-21 ENCOUNTER — Encounter (HOSPITAL_COMMUNITY)
Admission: RE | Admit: 2017-09-21 | Discharge: 2017-09-21 | Disposition: A | Discharge status: Home / Self Care | Payer: Medicare HMO | Source: Ambulatory Visit | Attending: Internal Medicine | Admitting: Internal Medicine

## 2017-09-21 DIAGNOSIS — I440 Atrioventricular block, first degree: Secondary | ICD-10-CM | POA: Insufficient documentation

## 2017-09-21 DIAGNOSIS — K222 Esophageal obstruction: Secondary | ICD-10-CM | POA: Insufficient documentation

## 2017-09-21 DIAGNOSIS — I10 Essential (primary) hypertension: Secondary | ICD-10-CM | POA: Insufficient documentation

## 2017-09-21 DIAGNOSIS — Z01818 Encounter for other preprocedural examination: Secondary | ICD-10-CM | POA: Insufficient documentation

## 2017-09-21 LAB — CBC
HCT: 39.3 % (ref 39.0–52.0)
Hemoglobin: 12.5 g/dL — ABNORMAL LOW (ref 13.0–17.0)
MCH: 28.5 pg (ref 26.0–34.0)
MCHC: 31.8 g/dL (ref 30.0–36.0)
MCV: 89.7 fL (ref 78.0–100.0)
PLATELETS: 253 10*3/uL (ref 150–400)
RBC: 4.38 MIL/uL (ref 4.22–5.81)
RDW: 13.7 % (ref 11.5–15.5)
WBC: 12.5 10*3/uL — AB (ref 4.0–10.5)

## 2017-09-21 LAB — BASIC METABOLIC PANEL
ANION GAP: 11 (ref 5–15)
BUN: 19 mg/dL (ref 8–23)
CALCIUM: 9.1 mg/dL (ref 8.9–10.3)
CO2: 25 mmol/L (ref 22–32)
CREATININE: 0.93 mg/dL (ref 0.61–1.24)
Chloride: 104 mmol/L (ref 98–111)
GFR calc Af Amer: >60 mL/min (ref >60)
GFR calc non Af Amer: >60 mL/min (ref >60)
GLUCOSE: 213 mg/dL — AB (ref 70–99)
Potassium: 3.8 mmol/L (ref 3.5–5.1)
Sodium: 140 mmol/L (ref 135–145)

## 2017-09-26 ENCOUNTER — Encounter (HOSPITAL_COMMUNITY)
Admission: RE | Disposition: A | Discharge status: Skilled Nursing Facility | Payer: Self-pay | Source: Ambulatory Visit | Attending: Internal Medicine

## 2017-09-26 ENCOUNTER — Encounter (HOSPITAL_COMMUNITY): Payer: Self-pay

## 2017-09-26 ENCOUNTER — Ambulatory Visit (HOSPITAL_COMMUNITY): Payer: Medicare HMO | Admitting: Anesthesiology

## 2017-09-26 ENCOUNTER — Ambulatory Visit (HOSPITAL_COMMUNITY)
Admission: RE | Admit: 2017-09-26 | Discharge: 2017-09-26 | Disposition: A | Discharge status: Skilled Nursing Facility | Payer: Medicare HMO | Source: Ambulatory Visit | Attending: Internal Medicine | Admitting: Internal Medicine

## 2017-09-26 DIAGNOSIS — Z8719 Personal history of other diseases of the digestive system: Secondary | ICD-10-CM | POA: Diagnosis not present

## 2017-09-26 DIAGNOSIS — Z8711 Personal history of peptic ulcer disease: Secondary | ICD-10-CM | POA: Insufficient documentation

## 2017-09-26 DIAGNOSIS — Z888 Allergy status to other drugs, medicaments and biological substances status: Secondary | ICD-10-CM | POA: Diagnosis not present

## 2017-09-26 DIAGNOSIS — Z7984 Long term (current) use of oral hypoglycemic drugs: Secondary | ICD-10-CM | POA: Diagnosis not present

## 2017-09-26 DIAGNOSIS — Z885 Allergy status to narcotic agent status: Secondary | ICD-10-CM | POA: Insufficient documentation

## 2017-09-26 DIAGNOSIS — J449 Chronic obstructive pulmonary disease, unspecified: Secondary | ICD-10-CM | POA: Insufficient documentation

## 2017-09-26 DIAGNOSIS — F329 Major depressive disorder, single episode, unspecified: Secondary | ICD-10-CM | POA: Insufficient documentation

## 2017-09-26 DIAGNOSIS — K449 Diaphragmatic hernia without obstruction or gangrene: Secondary | ICD-10-CM

## 2017-09-26 DIAGNOSIS — R1310 Dysphagia, unspecified: Secondary | ICD-10-CM

## 2017-09-26 DIAGNOSIS — Z79899 Other long term (current) drug therapy: Secondary | ICD-10-CM | POA: Diagnosis not present

## 2017-09-26 DIAGNOSIS — I10 Essential (primary) hypertension: Secondary | ICD-10-CM | POA: Diagnosis not present

## 2017-09-26 DIAGNOSIS — Z880 Allergy status to penicillin: Secondary | ICD-10-CM | POA: Insufficient documentation

## 2017-09-26 DIAGNOSIS — I4891 Unspecified atrial fibrillation: Secondary | ICD-10-CM | POA: Insufficient documentation

## 2017-09-26 DIAGNOSIS — Z87891 Personal history of nicotine dependence: Secondary | ICD-10-CM | POA: Insufficient documentation

## 2017-09-26 DIAGNOSIS — D649 Anemia, unspecified: Secondary | ICD-10-CM

## 2017-09-26 DIAGNOSIS — Z8546 Personal history of malignant neoplasm of prostate: Secondary | ICD-10-CM | POA: Diagnosis not present

## 2017-09-26 DIAGNOSIS — Z881 Allergy status to other antibiotic agents status: Secondary | ICD-10-CM | POA: Insufficient documentation

## 2017-09-26 DIAGNOSIS — K279 Peptic ulcer, site unspecified, unspecified as acute or chronic, without hemorrhage or perforation: Secondary | ICD-10-CM

## 2017-09-26 DIAGNOSIS — E119 Type 2 diabetes mellitus without complications: Secondary | ICD-10-CM | POA: Diagnosis not present

## 2017-09-26 DIAGNOSIS — Z882 Allergy status to sulfonamides status: Secondary | ICD-10-CM | POA: Insufficient documentation

## 2017-09-26 DIAGNOSIS — Z8249 Family history of ischemic heart disease and other diseases of the circulatory system: Secondary | ICD-10-CM | POA: Diagnosis not present

## 2017-09-26 DIAGNOSIS — K469 Unspecified abdominal hernia without obstruction or gangrene: Secondary | ICD-10-CM

## 2017-09-26 HISTORY — PX: ESOPHAGOGASTRODUODENOSCOPY (EGD) WITH PROPOFOL: SHX5813

## 2017-09-26 LAB — GLUCOSE, CAPILLARY: Glucose-Capillary: 98 mg/dL (ref 70–99)

## 2017-09-26 SURGERY — ESOPHAGOGASTRODUODENOSCOPY (EGD) WITH PROPOFOL
Anesthesia: General

## 2017-09-26 MED ORDER — LIDOCAINE HCL (CARDIAC) PF 50 MG/5ML IV SOSY
PREFILLED_SYRINGE | INTRAVENOUS | Status: DC | PRN
  Administered 2017-09-26: 40 mg via INTRAVENOUS

## 2017-09-26 MED ORDER — GLYCOPYRROLATE 0.2 MG/ML IJ SOLN
INTRAMUSCULAR | Status: AC
  Filled 2017-09-26: qty 1

## 2017-09-26 MED ORDER — SUCCINYLCHOLINE CHLORIDE 20 MG/ML IJ SOLN
INTRAMUSCULAR | Status: AC
  Filled 2017-09-26: qty 1

## 2017-09-26 MED ORDER — CHLORHEXIDINE GLUCONATE CLOTH 2 % EX PADS: 6.0000 | MEDICATED_PAD | Freq: Once | CUTANEOUS | Status: DC

## 2017-09-26 MED ORDER — LIDOCAINE HCL (PF) 1 % IJ SOLN
INTRAMUSCULAR | Status: AC
  Filled 2017-09-26: qty 5

## 2017-09-26 MED ORDER — STERILE WATER FOR IRRIGATION IR SOLN
Status: DC | PRN
  Administered 2017-09-26: 1.5 mL

## 2017-09-26 MED ORDER — FENTANYL CITRATE (PF) 100 MCG/2ML IJ SOLN: 25.0000 ug | INTRAMUSCULAR | Status: DC | PRN

## 2017-09-26 MED ORDER — PROPOFOL 10 MG/ML IV BOLUS
INTRAVENOUS | Status: AC
  Filled 2017-09-26: qty 80

## 2017-09-26 MED ORDER — PROPOFOL 500 MG/50ML IV EMUL
INTRAVENOUS | Status: DC | PRN
  Administered 2017-09-26: 150 ug/kg/min via INTRAVENOUS

## 2017-09-26 MED ORDER — GLYCOPYRROLATE 0.2 MG/ML IJ SOLN
INTRAMUSCULAR | Status: DC | PRN
  Administered 2017-09-26: 0.2 mg via INTRAVENOUS

## 2017-09-26 MED ORDER — LACTATED RINGERS IV SOLN
INTRAVENOUS | Status: DC | PRN
  Administered 2017-09-26: 07:00:00 via INTRAVENOUS

## 2017-09-26 MED ORDER — LACTATED RINGERS IV SOLN: INTRAVENOUS | Status: DC

## 2017-09-26 MED ORDER — PROPOFOL 10 MG/ML IV BOLUS
INTRAVENOUS | Status: DC | PRN
  Administered 2017-09-26: 30 mg via INTRAVENOUS
  Administered 2017-09-26: 20 mg via INTRAVENOUS

## 2017-09-26 NOTE — Addendum Note (Signed)
Addendum  created 09/26/17 1003 by Mickel Baas, CRNA   SmartForm saved

## 2017-09-26 NOTE — Anesthesia Procedure Notes (Signed)
Procedure Name: MAC Date/Time: 09/26/2017 7:31 AM Performed by: Andree Elk Tracie Dore A, CRNA Pre-anesthesia Checklist: Patient identified, Emergency Drugs available, Suction available, Patient being monitored and Timeout performed Oxygen Delivery Method: Simple face mask

## 2017-09-26 NOTE — Anesthesia Preprocedure Evaluation (Signed)
Anesthesia Evaluation  Patient identified by MRN, date of birth, ID band Patient awake    Reviewed: Allergy & Precautions, NPO status , Patient's Chart, lab work & pertinent test results  Airway Mallampati: II       Dental  (+) Poor Dentition, Missing   Pulmonary neg pulmonary ROS, asthma , COPD,  COPD inhaler, former smoker,   Productive cough today     + decreased breath sounds      Cardiovascular Exercise Tolerance: Poor hypertension, Pt. on medications negative cardio ROS Normal cardiovascular exam+ dysrhythmias Atrial Fibrillation II  H/o Afib , states hasnt been able to walk in two months  Here for anemia w/u  Denies receiving blood that he is aware of    Neuro/Psych PSYCHIATRIC DISORDERS Depression negative neurological ROS  negative psych ROS   GI/Hepatic negative GI ROS, Neg liver ROS, PUD,   Endo/Other  negative endocrine ROSdiabetes, Well Controlled, Type 2  Renal/GU negative Renal ROS  negative genitourinary   Musculoskeletal negative musculoskeletal ROS (+)   Abdominal   Peds negative pediatric ROS (+)  Hematology negative hematology ROS (+) anemia ,   Anesthesia Other Findings   Reproductive/Obstetrics negative OB ROS                             Anesthesia Physical Anesthesia Plan  ASA: III  Anesthesia Plan: General   Post-op Pain Management:    Induction:   PONV Risk Score and Plan:   Airway Management Planned: Simple Face Mask and Nasal Cannula  Additional Equipment:   Intra-op Plan:   Post-operative Plan:   Informed Consent:   Dental advisory given  Plan Discussed with: CRNA  Anesthesia Plan Comments:         Anesthesia Quick Evaluation

## 2017-09-26 NOTE — Op Note (Signed)
[facility] Patient Name: [name] Procedure Date: 09/26/2017 7:07 AM MRN: [id number] Date of Birth: 03-04-1945 Attending MD: Norvel Richards , MD CSN: [id number] Age: 72 Admit Type: Outpatient Procedure:                Upper GI endoscopy Indications:              Dysphagia; history gastric ulcer Providers:                Norvel Richards, MD, Charlsie Quest. Theda Sers RN, RN,                            Aram Candela Referring MD:              Medicines:                Propofol per Anesthesia Complications:            No immediate complications. Estimated Blood Loss:     Estimated blood loss: none. Procedure:                Pre-Anesthesia Assessment:                           - Prior to the procedure, a History and Physical                            was performed, and patient medications and                            allergies were reviewed. The patient's tolerance of                            previous anesthesia was also reviewed. The risks                            and benefits of the procedure and the sedation                            options and risks were discussed with the patient.                            All questions were answered, and informed consent                            was obtained. Prior Anticoagulants: The patient has                            taken no previous anticoagulant or antiplatelet                            agents. ASA Grade Assessment: II - A patient with                            mild systemic disease. After reviewing the risks  and benefits, the patient was deemed in                            satisfactory condition to undergo the procedure.                           After obtaining informed consent, the endoscope was                            passed under direct vision. Throughout the                            procedure, the patient's blood pressure, pulse, and                            oxygen  saturations were monitored continuously. The                            [id number]-H190 ([id number]) scope was introduced through the                            and advanced to the second part of duodenum. The                            upper GI endoscopy was accomplished without                            difficulty. The patient tolerated the procedure                            well. Scope In: 7:37:43 AM Scope Out: 7:43:22 AM Total Procedure Duration: 0 hours 5 minutes 39 seconds  Findings:      The examined esophagus was normal.      A small hiatal hernia was present.      The cardia and gastric fundus and remainder of the stomach were normal       on retroflexion.      The duodenal bulb and second portion of the duodenum were normal. The       scope was withdrawn. Dilation was performed with a Maloney dilator with       mild resistance at 56 Fr. The dilation site was examined following       endoscope reinsertion and showed no change. Estimated blood loss: none. Impression:               - Normal esophagus. Dilated.                           - Small hiatal hernia.                           - Normal duodenal bulb and second portion of the                            duodenum.                           -  No specimens collected. Moderate Sedation:      Moderate (conscious) sedation was personally administered by an       anesthesia professional. The following parameters were monitored: oxygen       saturation, heart rate, blood pressure, respiratory rate, EKG, adequacy       of pulmonary ventilation, and response to care. Total physician       intraservice time was 15 minutes. Recommendation:           - Patient has a contact number available for                            emergencies. The signs and symptoms of potential                            delayed complications were discussed with the                            patient. Return to normal activities tomorrow.                             Written discharge instructions were provided to the                            patient.                           - Resume previous diet.                           - Continue present medications.                           - No repeat upper endoscopy.                           - Return to GI office PRN. Procedure Code(s):        --- Professional ---                           [phone], Esophagogastroduodenoscopy, flexible,                            transoral; diagnostic, including collection of                            specimen(s) by brushing or washing, when performed                            (separate procedure)                           43450, Dilation of esophagus, by unguided sound or                            bougie, single or multiple passes Diagnosis Code(s):        --- Professional ---  K44.9, Diaphragmatic hernia without obstruction or                            gangrene                           R13.10, Dysphagia, unspecified CPT copyright 2017 American Medical Association. All rights reserved. The codes documented in this report are preliminary and upon coder review may  be revised to meet current compliance requirements. Cristopher Estimable. Kittie Krizan, MD Norvel Richards, MD 09/26/2017 7:53:20 AM This report has been signed electronically. Number of Addenda: 0

## 2017-09-26 NOTE — Anesthesia Postprocedure Evaluation (Signed)
Anesthesia Post Note  Patient: [name]  Procedure(s) Performed: ESOPHAGOGASTRODUODENOSCOPY (EGD) WITH DILATION WITH PROPOFOL (N/A )  Patient location during evaluation: PACU Level of consciousness: awake and alert, oriented and patient cooperative Pain management: pain level controlled Vital Signs Assessment: post-procedure vital signs reviewed and stable Respiratory status: spontaneous breathing and patient connected to nasal cannula oxygen Cardiovascular status: stable Postop Assessment: no apparent nausea or vomiting Anesthetic complications: no     Last Vitals:  Vitals:   09/26/17 0715 09/26/17 0752  BP: (!) 118/58 (!) 94/55  Pulse:  79  Resp: 17 14  Temp:  36.8 C  SpO2: 94% 100%    Last Pain:  Vitals:   09/26/17 0752  TempSrc:   PainSc: (P) Asleep                 ADAMS, AMY A

## 2017-09-26 NOTE — Addendum Note (Signed)
Addendum  created 09/26/17 1346 by Mickel Baas, CRNA   Intraprocedure Flowsheets edited, Sign clinical note, SmartForm saved

## 2017-09-26 NOTE — Transfer of Care (Addendum)
Immediate Anesthesia Transfer of Care Note  Patient: [name]  Procedure(s) Performed: ESOPHAGOGASTRODUODENOSCOPY (EGD) WITH DILATION WITH PROPOFOL (N/A )  Patient Location: PACU  Anesthesia Type:General  Level of Consciousness: sedated  Airway & Oxygen Therapy: Patient Spontanous Breathing  Post-op Assessment: Report given to RN and Post -op Vital signs reviewed and stable  Post vital signs: Reviewed and stable  Last Vitals:  Vitals Value Taken Time  BP 94/55 09/26/2017  7:52 AM  Temp 36.8 C 09/26/2017  7:52 AM  Pulse 79 09/26/2017  7:52 AM  Resp 13 09/26/2017  7:52 AM  SpO2 96 % 09/26/2017  7:52 AM  Vitals shown include unvalidated device data.  Last Pain:  Vitals:   09/26/17 0733  TempSrc:   PainSc: 0-No pain      Patients Stated Pain Goal: 6 (01/27/01 4961)  Complications: No apparent anesthesia complications

## 2017-09-26 NOTE — Discharge Instructions (Signed)
EGD Discharge instructions Please read the instructions outlined below and refer to this sheet in the next few weeks. These discharge instructions provide you with general information on caring for yourself after you leave the hospital. Your doctor may also give you specific instructions. While your treatment has been planned according to the most current medical practices available, unavoidable complications occasionally occur. If you have any problems or questions after discharge, please call your doctor. ACTIVITY  You may resume your regular activity but move at a slower pace for the next 24 hours.   Take frequent rest periods for the next 24 hours.   Walking will help expel (get rid of) the air and reduce the bloated feeling in your abdomen.   No driving for 24 hours (because of the anesthesia (medicine) used during the test).   You may shower.   Do not sign any important legal documents or operate any machinery for 24 hours (because of the anesthesia used during the test).  NUTRITION  Drink plenty of fluids.   You may resume your normal diet.   Begin with a light meal and progress to your normal diet.   Avoid alcoholic beverages for 24 hours or as instructed by your caregiver.  MEDICATIONS  You may resume your normal medications unless your caregiver tells you otherwise.  WHAT YOU CAN EXPECT TODAY  You may experience abdominal discomfort such as a feeling of fullness or gas pains.  FOLLOW-UP  Your doctor will discuss the results of your test with you.  SEEK IMMEDIATE MEDICAL ATTENTION IF ANY OF THE FOLLOWING OCCUR:  Excessive nausea (feeling sick to your stomach) and/or vomiting.   Severe abdominal pain and distention (swelling).   Trouble swallowing.   Temperature over 101 F (37.8 C).   Rectal bleeding or vomiting of blood.    Moderate Conscious Sedation, Adult, Care After These instructions provide you with information about caring for yourself after your  procedure. Your health care provider may also give you more specific instructions. Your treatment has been planned according to current medical practices, but problems sometimes occur. Call your health care provider if you have any problems or questions after your procedure. What can I expect after the procedure? After your procedure, it is common:  To feel sleepy for several hours.  To feel clumsy and have poor balance for several hours.  To have poor judgment for several hours.  To vomit if you eat too soon.  Follow these instructions at home: For at least 24 hours after the procedure:   Do not: ? Participate in activities where you could fall or become injured. ? Drive. ? Use heavy machinery. ? Drink alcohol. ? Take sleeping pills or medicines that cause drowsiness. ? Make important decisions or sign legal documents. ? Take care of children on your own.  Rest. Eating and drinking  Follow the diet recommended by your health care provider.  If you vomit: ? Drink water, juice, or soup when you can drink without vomiting. ? Make sure you have little or no nausea before eating solid foods. General instructions  Have a responsible adult stay with you until you are awake and alert.  Take over-the-counter and prescription medicines only as told by your health care provider.  If you smoke, do not smoke without supervision.  Keep all follow-up visits as told by your health care provider. This is important. Contact a health care provider if:  You keep feeling nauseous or you keep vomiting.  You feel light-headed.  You develop a rash.  You have a fever. Get help right away if:  You have trouble breathing. This information is not intended to replace advice given to you by your health care provider. Make sure you discuss any questions you have with your health care provider. Document Released: 10/25/2012 Document Revised: 06/09/2015 Document Reviewed: 04/26/2015 Elsevier  Interactive Patient Education  2018 [place] with primary care physician  Call if you have any recurrent GI issues.  Avoid NSAID/ASA medications in the future

## 2017-09-26 NOTE — H&P (Signed)
@LOGO @   Primary Care Physician:  Renata Caprice, DO Primary Gastroenterologist:  Dr. Gala Romney  Pre-Procedure History & Physical: HPI:  [name] is a 72 y.o. male here for follow-up EGD for history of peptic ulcer disease. Has had melena earlier in the year. Esophageal stenosis previously dilated in [place].  Past Medical History:  Diagnosis Date  . Asthma   . Atrial flutter ([place])    s/p ablation 08-2013  . COPD (chronic obstructive pulmonary disease) ([place])   . Depression   . Diabetes mellitus without complication ([place])    TYPE 2   . Hypertension   . Nonsustained ventricular tachycardia (HCC)    no clinical significance at present  . Prostate cancer ([place])   . S/P hip replacement    remote  . Ulcerative colitis ([place])    HX OF     Past Surgical History:  Procedure Laterality Date  . ABLATION  09/07/2013   CTI ablation by Dr Caryl Comes  . ATRIAL FLUTTER ABLATION N/A 09/07/2013   Procedure: ATRIAL FLUTTER ABLATION;  Surgeon: Deboraha Sprang, MD;  Location: [facility] CATH LAB;  Service: Cardiovascular;  Laterality: N/A;  . BACK SURGERY    . HIP SURGERY Right   . ILEOSTOMY    . TONSILLECTOMY      Prior to Admission medications   Medication Sig Start Date End Date Taking? Authorizing Provider  atorvastatin (LIPITOR) 20 MG tablet Take 20 mg by mouth daily. (0900)   Yes [provider]  Cholecalciferol 50000 units capsule Take 50,000 Units by mouth every Monday. (0900)   Yes [provider]  citalopram (CELEXA) 20 MG tablet Take 20 mg by mouth at bedtime. (2000)   Yes [provider]  diltiazem (CARDIZEM) 30 MG tablet Take 30 mg by mouth every 8 (eight) hours. (0800, 1400, & 2200)   Yes [provider]  doxycycline (VIBRA-TABS) 100 MG tablet Take 100 mg by mouth 2 (two) times daily.   Yes [provider]  fluticasone (FLOVENT HFA) 220 MCG/ACT inhaler Inhale 1 puff into the lungs 2 (two) times daily. (0900 & 1700)   Yes [provider]   Melatonin 3 MG TABS Take 3 mg by mouth at bedtime. (2100)   Yes [provider]  metFORMIN (GLUCOPHAGE) 500 MG tablet Take 500 mg by mouth 2 (two) times daily with a meal. (0800 & 1700)   Yes [provider]  Multiple Vitamin (MULTIVITAMIN WITH MINERALS) TABS tablet Take 1 tablet by mouth daily. (0900)   Yes [provider]  OLANZapine (ZYPREXA) 20 MG tablet Take 20 mg by mouth daily. (0900)   Yes [provider]  Olopatadine HCl (PAZEO) 0.7 % SOLN Place 1 drop into both eyes daily. (0900)   Yes [provider]  predniSONE (DELTASONE) 5 MG tablet Take 5 mg by mouth daily with breakfast. (0900)   Yes [provider]  Probiotic Product (PROBIOTIC DAILY PO) Take 1 capsule by mouth 2 (two) times daily. (0900 & 1700)   Yes [provider]  ranitidine (ZANTAC) 150 MG tablet Take 150 mg by mouth daily. (0900)   Yes [provider]  tamsulosin (FLOMAX) 0.4 MG CAPS capsule Take 0.4 mg by mouth daily. (0900)   Yes [provider]  traMADol (ULTRAM) 50 MG tablet Take 1 tablet (50 mg total) by mouth every 6 (six) hours as needed for moderate pain or severe pain. 09/30/16  Yes Henreitta Leber, MD  pantoprazole (PROTONIX) 40 MG tablet Take 1  tablet (40 mg total) by mouth daily. Patient not taking: Reported on 09/14/2017 03/24/17   Carlis Stable, NP    Allergies as of 07/18/2017 - Review Complete 07/17/2017  Allergen Reaction Noted  . Cefuroxime Rash 02/28/2015  . Clarithromycin Rash 02/28/2015  . Erythromycin Rash   . Isradipine Rash 02/28/2015  . Morphine Itching and Other (See Comments)   . Penicillins Rash and Other (See Comments)   . Sulfonamide derivatives Rash     Family History  Problem Relation Age of Onset  . Heart attack Father   . Colon cancer Cousin     Social History   Socioeconomic History  . Marital status: Single    Spouse name: Not on file  . Number of children: Not on file  . Years of education:  Not on file  . Highest education level: Not on file  Occupational History  . Not on file  Social Needs  . Financial resource strain: Not on file  . Food insecurity:    Worry: Not on file    Inability: Not on file  . Transportation needs:    Medical: Not on file    Non-medical: Not on file  Tobacco Use  . Smoking status: Former Smoker    Packs/day: 3.00    Years: 10.00    Pack years: 30.00    Types: Cigarettes    Last attempt to quit: 12/03/1988    Years since quitting: 28.8  . Smokeless tobacco: Never Used  Substance and Sexual Activity  . Alcohol use: No    Alcohol/week: 1.0 standard drinks    Types: 1 Standard drinks or equivalent per week  . Drug use: No  . Sexual activity: Not on file  Lifestyle  . Physical activity:    Days per week: Not on file    Minutes per session: Not on file  . Stress: Not on file  Relationships  . Social connections:    Talks on phone: Not on file    Gets together: Not on file    Attends religious service: Not on file    Active member of club or organization: Not on file    Attends meetings of clubs or organizations: Not on file    Relationship status: Not on file  . Intimate partner violence:    Fear of current or ex partner: Not on file    Emotionally abused: Not on file    Physically abused: Not on file    Forced sexual activity: Not on file  Other Topics Concern  . Not on file  Social History Narrative   From a care home    Review of Systems: See HPI, otherwise negative ROS  Physical Exam: BP (!) 118/58   Pulse 68   Temp 98.3 F (36.8 C) (Oral)   Resp 17   SpO2 94%  General:   Alert,  Well-developed, well-nourished, pleasant and cooperative in NAD Neck:  Supple; no masses or thyromegaly. No significant cervical adenopathy. Lungs:  Clear throughout to auscultation.   No wheezes, crackles, or rhonchi. No acute distress. Heart:  Regular rate and rhythm; no murmurs, clicks, rubs,  or gallops. Abdomen: Non-distended, normal  bowel sounds.  Soft and nontender without appreciable mass or hepatosplenomegaly.  Pulses:  Normal pulses noted. Extremities:  Without clubbing or edema.  Impression/Plan:  72 year old gentleman history of peptic ulcer disease. Possible bleeding previously.  Here for a surveillance EGD.The risks, benefits, limitations, alternatives and imponderables have been reviewed with the patient. Potential for  esophageal dilation, biopsy, etc. have also been reviewed.  Questions have been answered. All parties agreeable. Patient does have recurrent esophageal dysphagia. May perform dilation as feasible/appropriate.     Notice: This dictation was prepared with Dragon dictation along with smaller phrase technology. Any transcriptional errors that result from this process are unintentional and may not be corrected upon review.

## 2017-09-30 ENCOUNTER — Encounter (HOSPITAL_COMMUNITY): Payer: Self-pay | Admitting: Internal Medicine

## 2017-10-17 ENCOUNTER — Ambulatory Visit: Payer: Medicare HMO | Admitting: Nurse Practitioner

## 2018-01-09 ENCOUNTER — Encounter: Payer: Self-pay | Admitting: Gastroenterology

## 2018-01-09 ENCOUNTER — Telehealth: Payer: Self-pay | Admitting: Gastroenterology

## 2018-01-09 ENCOUNTER — Other Ambulatory Visit: Payer: Self-pay

## 2018-01-09 ENCOUNTER — Ambulatory Visit (INDEPENDENT_AMBULATORY_CARE_PROVIDER_SITE_OTHER): Payer: Medicare HMO | Admitting: Gastroenterology

## 2018-01-09 ENCOUNTER — Encounter

## 2018-01-09 VITALS — BP 141/71 | HR 76 | Temp 97.9°F | Ht 69.5 in | Wt 177.0 lb

## 2018-01-09 DIAGNOSIS — K51018 Ulcerative (chronic) pancolitis with other complication: Secondary | ICD-10-CM

## 2018-01-09 DIAGNOSIS — D649 Anemia, unspecified: Secondary | ICD-10-CM | POA: Diagnosis not present

## 2018-01-09 DIAGNOSIS — K439 Ventral hernia without obstruction or gangrene: Secondary | ICD-10-CM

## 2018-01-09 DIAGNOSIS — R197 Diarrhea, unspecified: Secondary | ICD-10-CM

## 2018-01-09 NOTE — Patient Instructions (Signed)
Once we receive copy of your medication list from the center we can consider making adjustments if needed.   Referral to [facility] Surgery to abdominal wall hernia.   We will try to get old records once again from [facility] and [place].

## 2018-01-09 NOTE — Telephone Encounter (Signed)
Called the [facility] and line busy.

## 2018-01-09 NOTE — Telephone Encounter (Addendum)
Called [facility] and was advised they have not filled any medications for him since 2018. I also have called [facility] and still unable to get through

## 2018-01-09 NOTE — Assessment & Plan Note (Signed)
She complains of frequent abdominal pain to the left of the ileostomy site but also upper at the site of ventral hernia.  CT in February shows mild parastomal hernia as well.  He would like consider surgical referral for management of hernia(s) as indicated.

## 2018-01-09 NOTE — Progress Notes (Addendum)
Primary Care Physician: Renata Caprice, DO  Primary Gastroenterologist:  Garfield Cornea, MD   Chief Complaint  Patient presents with  . Peptic Ulcer Disease  . Anemia    HPI: [name] is a 72 y.o. male here for follow-up.  Patient currently resides at group home/nursing facility. Patient does not recall the name of it. We have [facility] listed but no one answering phone. He presented by himself on public transportation without a med list. Patient has a history of peptic ulcer disease, chronic diarrhea, dysphagia, ulcerative colitis status post total colectomy in the 57s.  EGD done back in September 2019 for dysphagia and history of peptic ulcer disease was unremarkable except for small hiatal hernia.  Normal-appearing esophagus status post dilation. Apparently back earlier in 2019 patient had EGD at [facility] in [place] he was found to have small upper esophageal sphincter stenosis, small gastric ulcer status post dilation with recommended follow-up EGD.  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  He describes having a second surgery with removal of his entire colon.  States he cannot be done at the initial surgery because of significant disease.  States his rectum was sewed up.  Last colonoscopy predated surgery.  He states he was diagnosed with UC a few years prior to the surgery.  Empty bag several times per day. During the night as well. Green or brown stools. No blood in bag lately. Some abdominal pain around the ileostomy at times.  Abdominal pain located to the left of the ileostomy and upward at site of ventral hernia.  Several times per week. Some nausea with eggs. Weight down about 8 pounds in the past 3 months. Some heartburn. Not too bad except Ketchup/tomato sauce. Swallowing much better.   WE DID NOT HAVE MEDICATION LIST AT TIME OF OFFICE VISIT. NO ANSWER FROM NURSING HOME AND PHARMACY NOT OPEN. WE WILL  OBTAIN COPY OF LIST OF CURRENT MEDS.    Current Outpatient Medications  Medication Sig Dispense Refill  . atorvastatin (LIPITOR) 10 MG tablet Take 10 mg by mouth daily.     . Cholecalciferol 50000 units capsule Take 50,000 Units by mouth every 7 (seven) days.     . citalopram (CELEXA) 20 MG tablet Take 20 mg by mouth at bedtime.     [name] diltiazem (CARDIZEM) 30 MG tablet Take 30 mg by mouth every 8 (eight) hours.     . famotidine (PEPCID) 20 MG tablet Take 20 mg by mouth daily.    . fluticasone (FLOVENT HFA) 220 MCG/ACT inhaler Inhale 1 puff into the lungs 2 (two) times daily.     . metFORMIN (GLUCOPHAGE) 500 MG tablet Take 500 mg by mouth 2 (two) times daily with a meal.     . Multiple Vitamin (MULTIVITAMIN WITH MINERALS) TABS tablet Take 1 tablet by mouth daily.     [name] nystatin (MYCOSTATIN/NYSTOP) powder Apply topically as needed.    [name] OLANZapine (ZYPREXA) 20 MG tablet Take 20 mg by mouth daily.     . Olopatadine HCl (PAZEO) 0.7 % SOLN Place 1 drop into both eyes daily.     . predniSONE (DELTASONE) 5 MG tablet Take 5 mg by mouth daily with breakfast.     . tamsulosin (FLOMAX) 0.4 MG CAPS capsule Take 0.4 mg by mouth daily. (0900)     No current facility-administered medications for this visit.     Allergies as of  01/09/2018 - Review Complete 01/09/2018  Allergen Reaction Noted  . Cefuroxime Rash 02/28/2015  . Clarithromycin Rash 02/28/2015  . Erythromycin Rash   . Isradipine Rash 02/28/2015  . Morphine Itching and Other (See Comments)   . Penicillins Rash and Other (See Comments)   . Sulfonamide derivatives Rash    Past Medical History:  Diagnosis Date  . Asthma   . Atrial flutter ([place])    s/p ablation 08-2013  . COPD (chronic obstructive pulmonary disease) ([place])   . Depression   . Diabetes mellitus without complication ([place])    TYPE 2   . Hypertension   . Nonsustained ventricular tachycardia (HCC)    no clinical significance at present  . Prostate cancer ([place])   . S/P hip  replacement    remote  . Ulcerative colitis ([place])    HX OF    Past Surgical History:  Procedure Laterality Date  . ABLATION  09/07/2013   CTI ablation by Dr Caryl Comes  . ATRIAL FLUTTER ABLATION N/A 09/07/2013   Procedure: ATRIAL FLUTTER ABLATION;  Surgeon: Deboraha Sprang, MD;  Location: [facility] CATH LAB;  Service: Cardiovascular;  Laterality: N/A;  . BACK SURGERY    . COLECTOMY  1960s   per patient also had rectum removed   . ESOPHAGOGASTRODUODENOSCOPY (EGD) WITH PROPOFOL N/A 09/26/2017   Procedure: ESOPHAGOGASTRODUODENOSCOPY (EGD) WITH DILATION WITH PROPOFOL;  Surgeon: Daneil Dolin, MD;  [facility];  Service: Endoscopy;  Laterality: N/A;  7:30am  . HIP SURGERY Right   . ILEOSTOMY    . TONSILLECTOMY     Family History  Problem Relation Age of Onset  . Heart attack Father   . Colon cancer Cousin    Social History   Tobacco Use  . Smoking status: Former Smoker    Packs/day: 3.00    Years: 10.00    Pack years: 30.00    Types: Cigarettes    Last attempt to quit: 12/03/1988    Years since quitting: 29.1  . Smokeless tobacco: Never Used  Substance Use Topics  . Alcohol use: No    Alcohol/week: 1.0 standard drinks    Types: 1 Standard drinks or equivalent per week  . Drug use: No    ROS:  General: Negative for anorexia,  fever, chills, fatigue, weakness.  See HPI ENT: Negative for hoarseness, difficulty swallowing , nasal congestion. CV: Negative for chest pain, angina, palpitations, dyspnea on exertion, peripheral edema.  Respiratory: Negative for dyspnea at rest, dyspnea on exertion, cough, sputum, wheezing.  GI: See history of present illness. GU:  Negative for dysuria, hematuria, urinary incontinence, urinary frequency, nocturnal urination.  Endo: Negative for unusual weight change.    Physical Examination:   BP (!) 141/71   Pulse 76   Temp 97.9 F (36.6 C) (Oral)   Ht 5' 9.5" (1.765 m)   Wt 177 lb (80.3 kg)   BMI 25.76 kg/m   General: Well-nourished,  well-developed in no acute distress.  Required significant assistance to get the patient up to weigh in to do a rectal exam today.  States he has not walked much since his hip surgery. Eyes: No icterus. Mouth: Oropharyngeal mucosa moist and pink , no lesions erythema or exudate. Lungs: Clear to auscultation bilaterally.  Heart: Regular rate and rhythm, no murmurs rubs or gallops.  Abdomen: Bowel sounds are normal, nondistended, no hepatosplenomegaly or masses, no abdominal bruits, no rebound or guarding.  Ileostomy in the right lower quadrant, soft green stool present.  Small ventral  hernia above into the left of ileostomy, easily reducible but tender on exam. Rectal exam: Examined external perianal area, no anus present. Extremities: No lower extremity edema. No clubbing or deformities. Neuro: Alert and oriented x 4   Skin: Warm and dry, no jaundice.   Psych: Alert and cooperative, normal mood and affect.  Labs:  Lab Results  Component Value Date   CREATININE 0.93 09/21/2017   BUN 19 09/21/2017   NA 140 09/21/2017   K 3.8 09/21/2017   CL 104 09/21/2017   CO2 25 09/21/2017   Lab Results  Component Value Date   ALT 24 03/08/2017   AST 41 03/08/2017   ALKPHOS 105 03/08/2017   BILITOT 0.5 03/08/2017   Lab Results  Component Value Date   WBC 12.5 (H) 09/21/2017   HGB 12.5 (L) 09/21/2017   HCT 39.3 09/21/2017   MCV 89.7 09/21/2017   PLT 253 09/21/2017   No results found for: IRON, TIBC, FERRITIN  Imaging Studies: No results found.

## 2018-01-09 NOTE — Telephone Encounter (Signed)
Continue to attempt to get med list.   Also he needs labs. I have placed orders.

## 2018-01-09 NOTE — Telephone Encounter (Signed)
[facility] and was put on hold x 10 minutes.

## 2018-01-09 NOTE — Progress Notes (Signed)
CC'D TO PCP °

## 2018-01-09 NOTE — Assessment & Plan Note (Addendum)
Patient gives a history of ulcerative colitis with ileostomy and proctocolectomy.  On exam today he has no visible anus.  Frequently empties his ileostomy although history is somewhat difficult from patient.  After we receive medication list we can potentially make some changes to decrease output if needed.  We will also check labs for any other contributing factors such as thyroid function and celiac disease.  1 last attempt to obtain records from [facility] and [facility] in [place].

## 2018-01-09 NOTE — Assessment & Plan Note (Signed)
Normocytic anemia.  Hemoglobin 12.5 in September.  Have been slightly low earlier in the year at 95.  History of known friable ostomy and documented peptic ulcer disease previously.  Surveillance EGD looked good back in September.  Plan to update labs including iron/TIBC/ferritin.  Try to get a copy of his medication list.

## 2018-01-10 NOTE — Addendum Note (Signed)
Addended by: Mahala Menghini on: 01/10/2018 11:35 AM   Modules accepted: Orders

## 2018-01-10 NOTE — Progress Notes (Addendum)
Received updated med list from [facility]. Updated med list and office note to reflect current medications.   Await pending labs.

## 2018-01-10 NOTE — Telephone Encounter (Signed)
Fax received and placed on LSL desk for review. Medications updated

## 2018-01-10 NOTE — Telephone Encounter (Signed)
[facility] and spoke with Jocelyn Lamer. She will fax over med list. She reports transport picked patient up w/o paperwork.

## 2018-01-16 ENCOUNTER — Telehealth: Payer: Self-pay

## 2018-01-16 NOTE — Telephone Encounter (Signed)
I have faxed the lab order to [facility] in [place] @ [phone].

## 2018-01-23 ENCOUNTER — Telehealth: Payer: Self-pay | Admitting: Gastroenterology

## 2018-01-23 NOTE — Telephone Encounter (Signed)
Records received from March 2019 admission, [facility] in [place].  Patient was admitted with acute metabolic encephalopathy likely due to UTI.  Questionable bilateral pneumonia versus edema.  CT head was negative.  I do not have the report but according to discharge summary he had an EGD with balloon dilation of a mild upper esophageal sphincter stenosis.  Had a small gastric ulcer as well.  Reportedly had an esophagram that showed achalasia and segmental narrowing of the proximal esophagus.

## 2018-01-23 NOTE — Telephone Encounter (Signed)
Need labs ordered on 01/09/18 (CBC, TTG, IGA, TSH, iron/tibc,ferritn).  Please request from the nursing home.

## 2018-01-24 ENCOUNTER — Telehealth: Payer: Self-pay | Admitting: Internal Medicine

## 2018-01-24 NOTE — Telephone Encounter (Signed)
I have refaxed the orders to [facility].

## 2018-01-24 NOTE — Telephone Encounter (Signed)
I've sent a request for the labs to [facility] in [place]

## 2018-01-24 NOTE — Telephone Encounter (Signed)
LSL wanted me to get the labs that were ordered on 12/30 from the [facility] in [place]. Madlyn Frankel from the [facility] called to say she did not received the lab orders and if we faxed them she could have the labs drawn tomorrow. I told her that [facility] had faxed the orders on 12/30 to [phone]. Jocelyn Lamer said she did not receive them and to refax them to the same fax number and put Attn: [place]

## 2018-01-26 ENCOUNTER — Telehealth: Payer: Self-pay

## 2018-01-26 NOTE — Telephone Encounter (Signed)
[facility] called and would like notes faxed to them at [phone] attention Clarise Cruz

## 2018-01-26 NOTE — Telephone Encounter (Signed)
Records refaxed to [facility] attn: Clarise Cruz.

## 2018-01-26 NOTE — Telephone Encounter (Signed)
[facility] to f/u on referral. Spoke to Judson Roch, they called facility to schedule an appt and facility was going to call them back. However, they haven't received return call to schedule appt. I mailed letter to facility.

## 2018-03-14 NOTE — Telephone Encounter (Signed)
I have faxed a letter to them along with the lab orders again and ask for someone to give [place] a call and let us know the status of the request.

## 2018-03-14 NOTE — Telephone Encounter (Signed)
See other telephone note from 01/24/2018 as well.   I still do not have lab results.

## 2018-03-14 NOTE — Telephone Encounter (Signed)
Called, line was busy.

## 2018-03-16 NOTE — Telephone Encounter (Signed)
Received a call from [place] at [facility]. The labs were done and she will be faxing those results to [place]. She said she is very sorry it is so difficult to get them, they do not have a person to answer the phone, just the nurses and they are so busy.

## 2018-03-16 NOTE — Telephone Encounter (Signed)
Called again, many rings, for over a min and no answer. Faxing a note to them to call right away.

## 2018-03-16 NOTE — Telephone Encounter (Signed)
Called, line busy.

## 2018-03-17 NOTE — Telephone Encounter (Signed)
Received the lab results and placed in [place] box for review.

## 2018-03-17 NOTE — Telephone Encounter (Addendum)
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  Initially treated with prednisone, hydrocortisone enemas, Azulfidine.  Patient did well for approximately 10 months.  He was unable to come off of prednisone.,  Ultimately being rehospitalized a couple of times.  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 Path c/w UC involving entire colon.  A couple of weeks later he had a second surgery with completion of proctectomy.  Upper GI series at that time showed no involvement of the terminal ileum.  PATIENT HAS NO RESIDUAL COLON OR RECTUM.  Patient needs routine follow up OV with [facility] only for chronic diarrhea/excessive ostomy output/ANEMIA. Please make sure nursing home sends someone with patient and current med list.

## 2018-03-17 NOTE — Telephone Encounter (Signed)
Labs dated 01/25/2018: White blood cell count 9400, hemoglobin 11.7 (normal 14-18), hematocrit 35.2, MCV 85.9, platelets 277,000, TSH 1.19, iron 54 low, TIBC 208 low, iron saturations 26.  TTG IgA antibody negative.  Hgb down slightly from 09/2017: 12.5.  Records from [facility] surgery dated February 23, 2018.  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  "Patient be at high risk ago and operation required to repair both hernias, and I would not recommend surgery unless needed".

## 2018-03-20 ENCOUNTER — Encounter: Payer: Self-pay | Admitting: Internal Medicine

## 2018-03-20 NOTE — Telephone Encounter (Signed)
Noted. Patient needs routine follow up OV with [facility] only for chronic diarrhea/excessive ostomy output/ANEMIA. Please make sure nursing home sends someone with patient and current med list.

## 2018-03-20 NOTE — Telephone Encounter (Signed)
SCHEDULED AND LETTER SENT  °

## 2018-04-12 ENCOUNTER — Telehealth: Payer: Self-pay | Admitting: Gastroenterology

## 2018-04-12 NOTE — Telephone Encounter (Signed)
Please request copy of esophagram done at [facility].

## 2018-04-14 NOTE — Telephone Encounter (Signed)
requested

## 2018-05-09 ENCOUNTER — Other Ambulatory Visit: Payer: Self-pay

## 2018-05-09 ENCOUNTER — Telehealth: Payer: Self-pay

## 2018-05-09 ENCOUNTER — Ambulatory Visit: Payer: Medicare HMO | Admitting: Internal Medicine

## 2018-05-09 NOTE — Telephone Encounter (Signed)
Pt scheduled for telephone visit with Dr. Gala Romney today. [facility] and spoke to Doctor, hospital, Sherlon Handing. Pt is currently in the hospital for dehydration and UTI.  Erline Levine, please reschedule appt.

## 2018-05-10 ENCOUNTER — Encounter: Payer: Self-pay | Admitting: Internal Medicine

## 2018-05-10 NOTE — Telephone Encounter (Signed)
PATIENT RESCHEDULED

## 2018-05-12 ENCOUNTER — Ambulatory Visit: Payer: Medicare HMO | Admitting: Internal Medicine

## 2018-05-18 ENCOUNTER — Telehealth: Payer: Self-pay | Admitting: Gastroenterology

## 2018-05-18 NOTE — Telephone Encounter (Signed)
Yes

## 2018-05-18 NOTE — Telephone Encounter (Signed)
Received copy of 01/2017 BPE: segmental narrowing of the proximal esophagus at level of C7 rule out spasm vs stricture without intrinsic mass. Mild dilatation of remaining esophagus except for the GEJ c/w early achalasia.   Of note patient had EGD 09/2017 for dysphagia. Had normal appearing esophagus s/p dilation.   Let's get patient in office in 07/2018 for follow up (abnormal BPE evaluate for swallowing issues,h/o UC).

## 2018-05-18 NOTE — Telephone Encounter (Signed)
Patient already has a scheduled visit 6/26. Is this okay to keep?

## 2018-07-14 ENCOUNTER — Ambulatory Visit: Payer: Medicare HMO | Admitting: Internal Medicine

## 2018-09-14 ENCOUNTER — Telehealth: Payer: Self-pay | Admitting: Internal Medicine

## 2018-09-14 NOTE — Telephone Encounter (Signed)
[facility] stated the patient passed away this past week

## 2018-09-14 NOTE — Telephone Encounter (Signed)
Communication noted.  

## 2018-09-15 ENCOUNTER — Ambulatory Visit: Payer: Medicare HMO | Admitting: Internal Medicine

## 2018-09-19 DEATH — deceased

## 2019-06-14 IMAGING — CT CT HEAD W/O CM
3 series · 16 of 47 positions shown, 19 images · non-contrast
Comparison: 08/29/2014

CLINICAL DATA: Fall.

EXAM:
CT HEAD WITHOUT CONTRAST
TECHNIQUE: Contiguous axial images were obtained from the base of the skull
through the vertex without intravenous contrast.

[Series 2: head wo · axial · 0.42mm/px · z∈[-134,-9]mm · 10 of 31 slices shown, 13 images]
[im 3/31  brain]
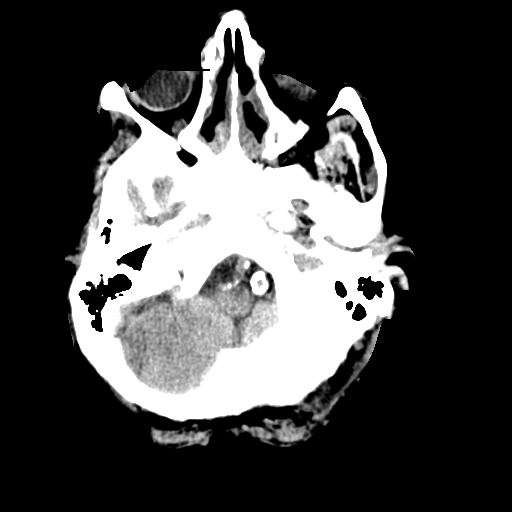
[im 3/31  bone]
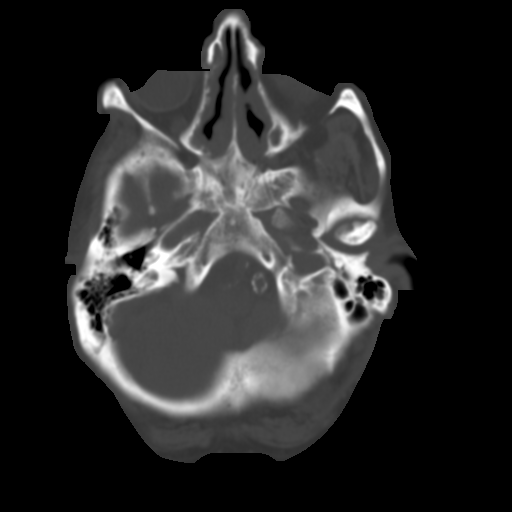
[im 6/31  brain]
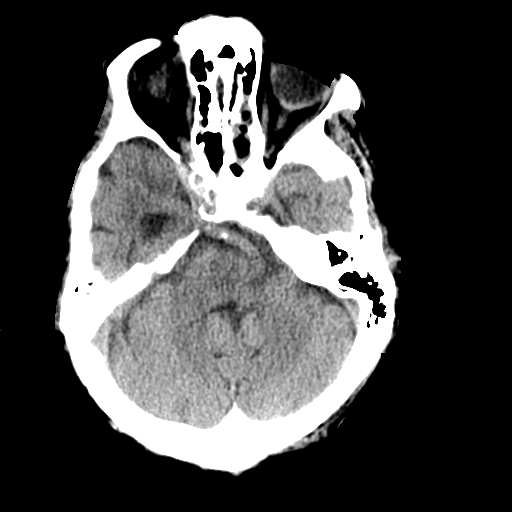
[im 9/31  brain]
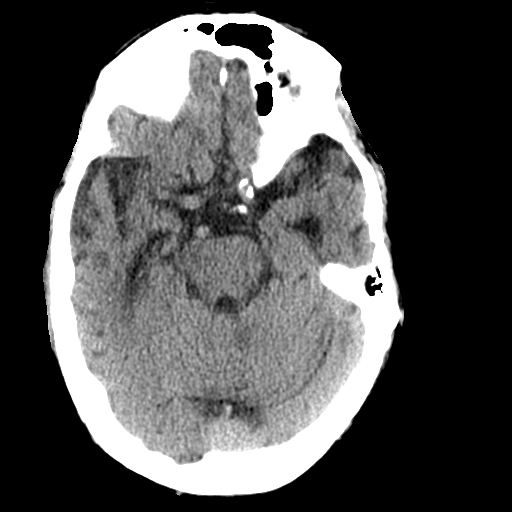
[im 11/31  brain]
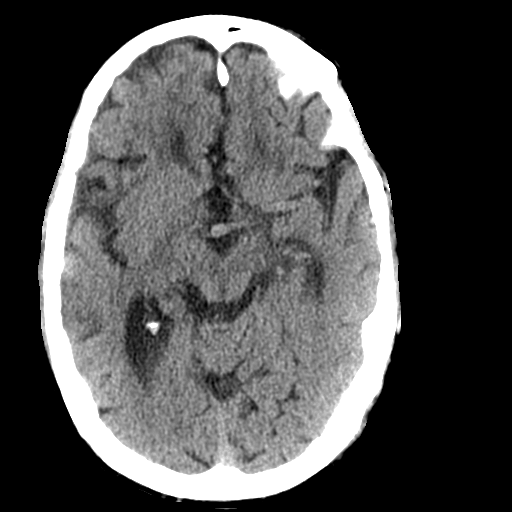
[im 14/31  brain]
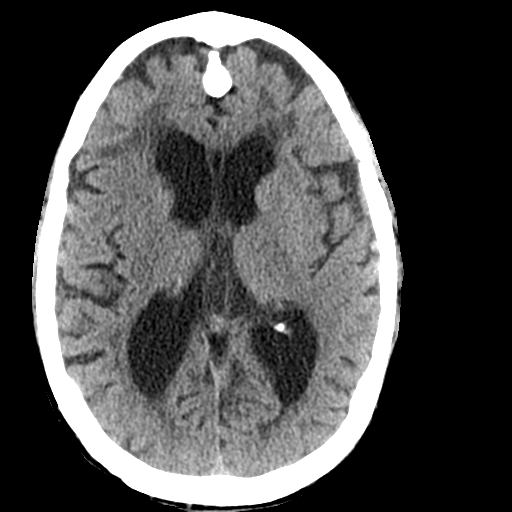
[im 14/31  bone]
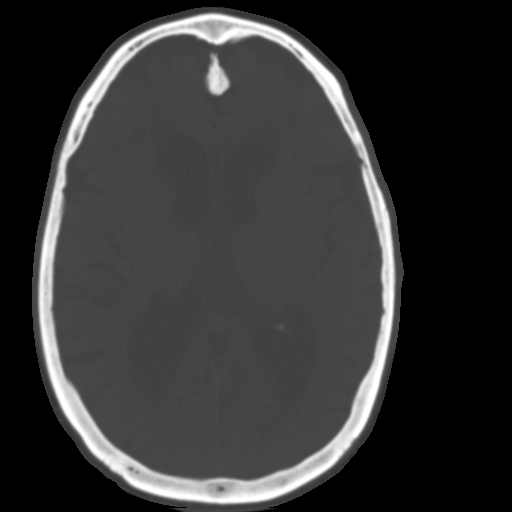
[im 17/31  brain]
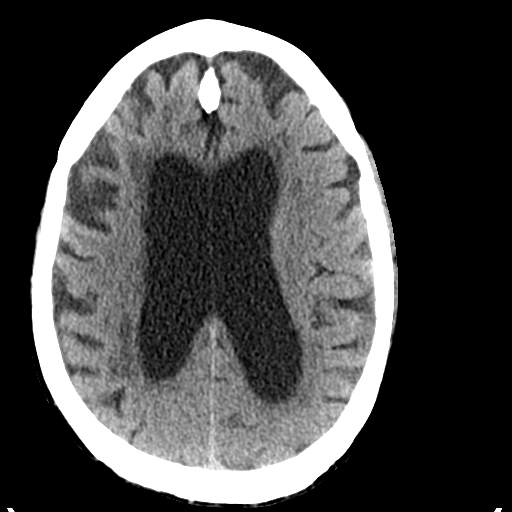
[im 20/31  brain]
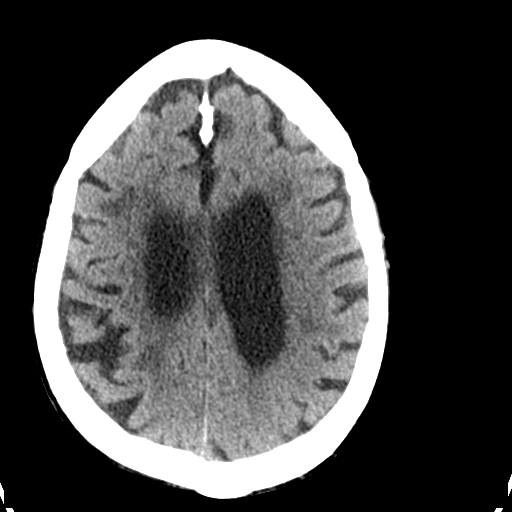
[im 23/31  brain]
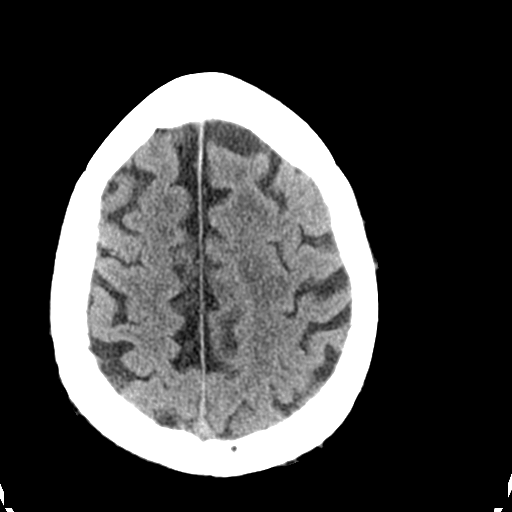
[im 25/31  brain]
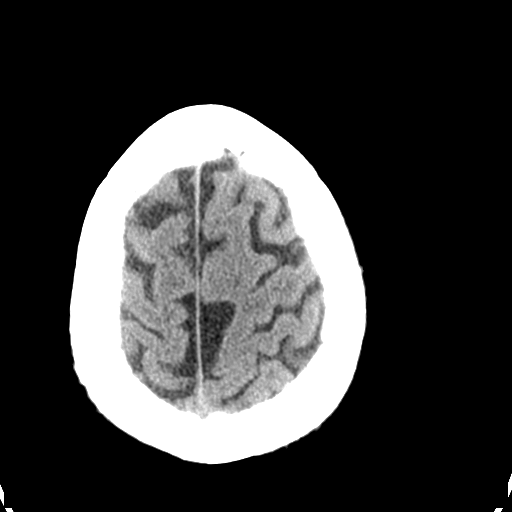
[im 25/31  bone]
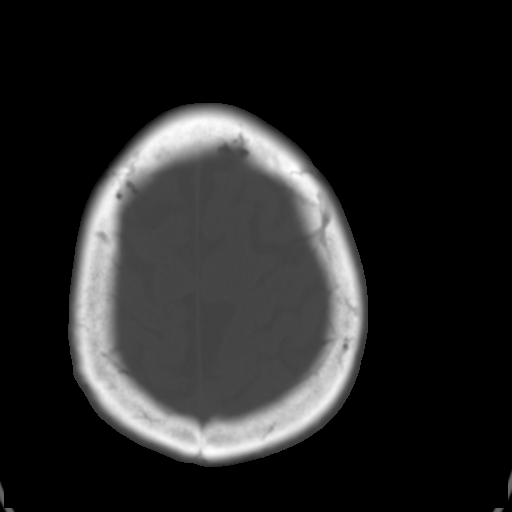
[im 28/31  brain]
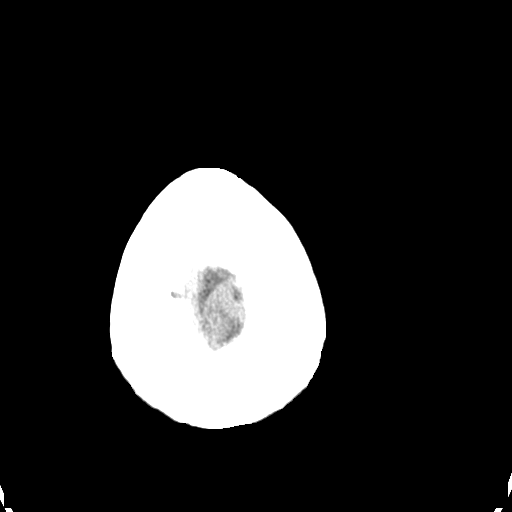

[Series 4: coronal soft tissue · coronal · 0.31mm/px · 3 of 72 slices shown]
[im 24/72  brain]
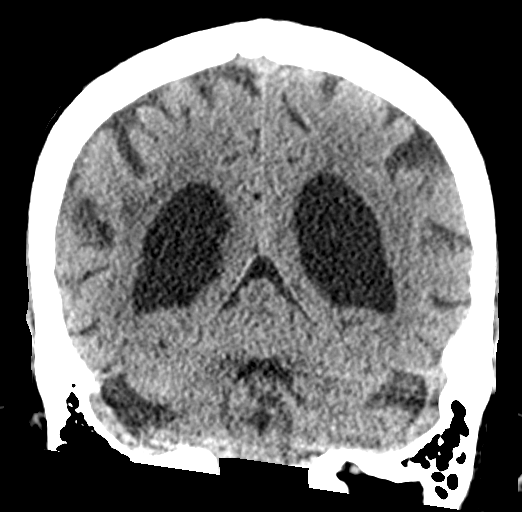
[im 32/72  brain]
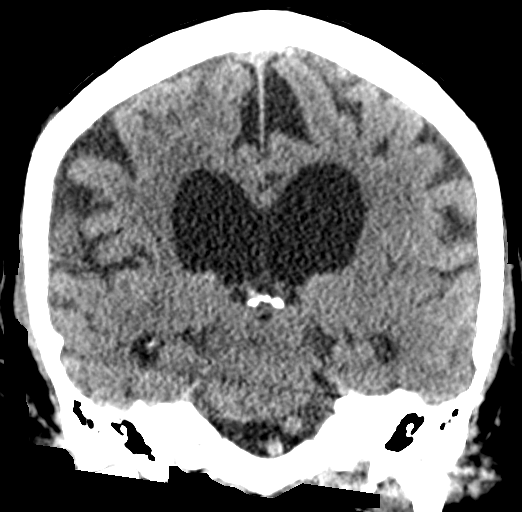
[im 40/72  brain]
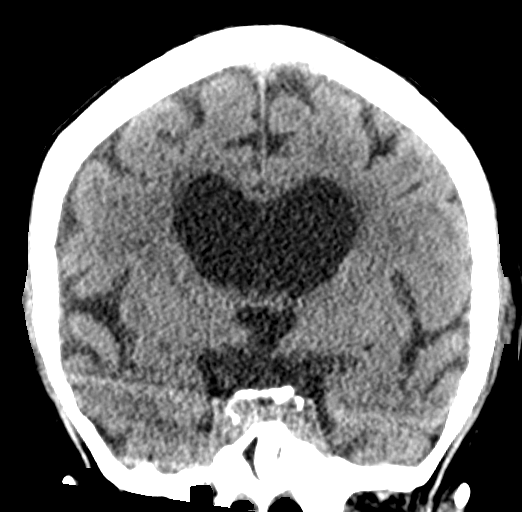

[Series 5: sagittal soft tissue · sagittal · 0.31mm/px · 3 of 55 slices shown]
[im 20/55  brain]
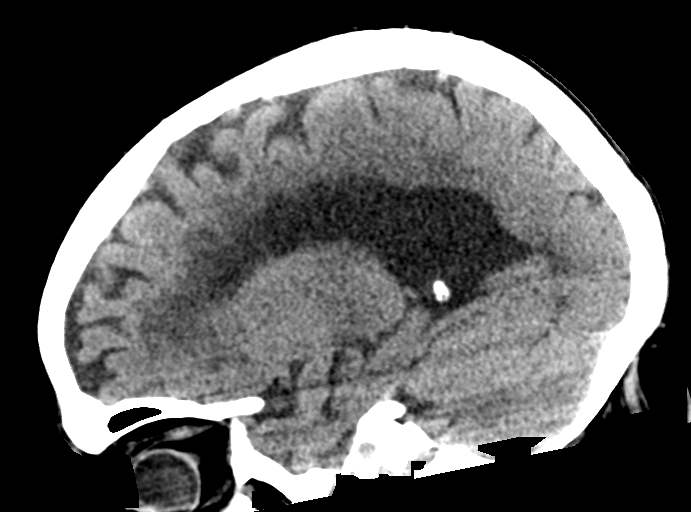
[im 28/55  brain]
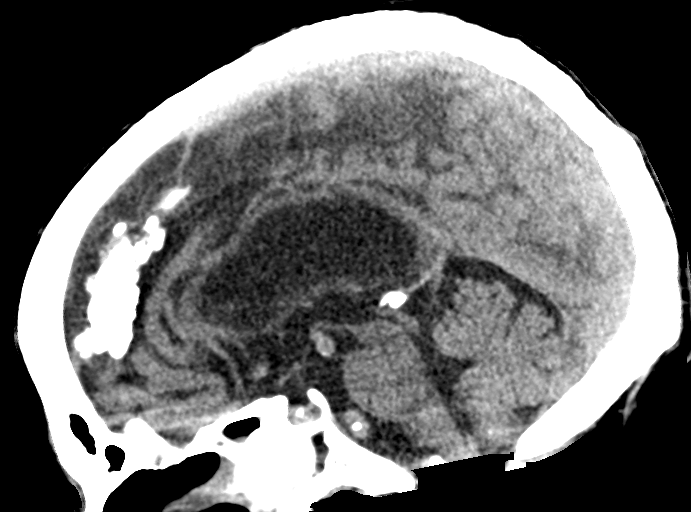
[im 35/55  brain]
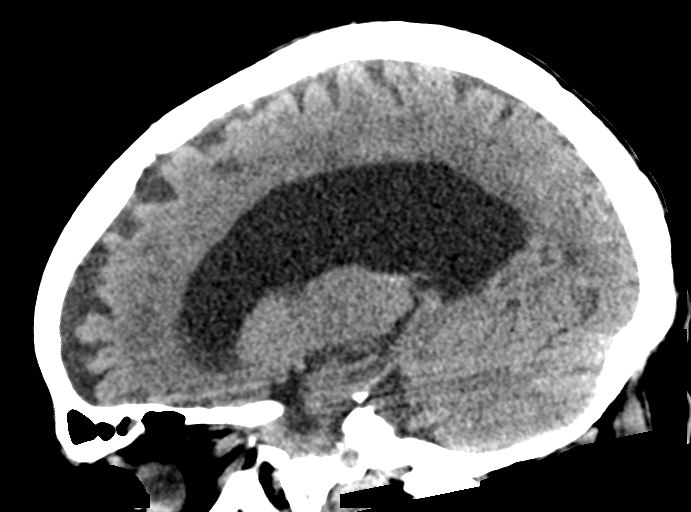

[16 of 47 positions shown; findings below may reference images not displayed]

FINDINGS: Brain: There is some progression of generalized atrophy since the
prior study. Small vessel disease in the periventricular white
matter appears stable. The brain demonstrates no evidence of
hemorrhage, acute infarction, edema, mass effect, extra-axial fluid
collection, hydrocephalus or mass lesion.

Vascular: No hyperdense vessel or unexpected calcification.

Skull: Normal. Negative for fracture or focal lesion.

Sinuses/Orbits: Stable mucosal thickening in bilateral ethmoid,
sphenoid and right frontal sinuses.

Other: None.
IMPRESSION: Progression of generalized cortical atrophy since the prior head CT.
Stable small vessel disease and chronic sinus disease. No acute
findings.
